# Patient Record
Sex: Male | Born: 1980 | Race: White | Hispanic: No | Marital: Married | Smoking: Current every day smoker
Health system: Southern US, Community
[De-identification: ages and names within clinical notes are randomized; demographics above are authoritative.]

## PROBLEM LIST (undated history)

## (undated) DIAGNOSIS — I10 Essential (primary) hypertension: Secondary | ICD-10-CM

## (undated) DIAGNOSIS — R51 Headache: Secondary | ICD-10-CM

## (undated) DIAGNOSIS — M549 Dorsalgia, unspecified: Secondary | ICD-10-CM

## (undated) DIAGNOSIS — F329 Major depressive disorder, single episode, unspecified: Secondary | ICD-10-CM

## (undated) DIAGNOSIS — F419 Anxiety disorder, unspecified: Secondary | ICD-10-CM

## (undated) DIAGNOSIS — J40 Bronchitis, not specified as acute or chronic: Secondary | ICD-10-CM

## (undated) DIAGNOSIS — F32A Depression, unspecified: Secondary | ICD-10-CM

## (undated) DIAGNOSIS — G8929 Other chronic pain: Secondary | ICD-10-CM

## (undated) HISTORY — PX: HERNIA REPAIR: SHX51

## (undated) HISTORY — DX: Depression, unspecified: F32.A

## (undated) HISTORY — DX: Other chronic pain: G89.29

## (undated) HISTORY — PX: BACK SURGERY: SHX140

## (undated) HISTORY — DX: Major depressive disorder, single episode, unspecified: F32.9

## (undated) HISTORY — DX: Headache: R51

## (undated) HISTORY — DX: Anxiety disorder, unspecified: F41.9

## (undated) HISTORY — DX: Dorsalgia, unspecified: M54.9

---

## 2003-05-16 ENCOUNTER — Emergency Department (HOSPITAL_COMMUNITY): Admission: EM | Admit: 2003-05-16 | Discharge: 2003-05-16 | Payer: Self-pay | Admitting: Emergency Medicine

## 2006-06-11 ENCOUNTER — Ambulatory Visit (HOSPITAL_COMMUNITY): Admission: RE | Admit: 2006-06-11 | Discharge: 2006-06-11 | Payer: Self-pay | Admitting: Family Medicine

## 2006-07-04 ENCOUNTER — Observation Stay (HOSPITAL_COMMUNITY): Admission: RE | Admit: 2006-07-04 | Discharge: 2006-07-05 | Payer: Self-pay | Admitting: Neurosurgery

## 2006-12-05 ENCOUNTER — Emergency Department (HOSPITAL_COMMUNITY): Admission: EM | Admit: 2006-12-05 | Discharge: 2006-12-05 | Payer: Self-pay | Admitting: Emergency Medicine

## 2007-01-04 ENCOUNTER — Emergency Department (HOSPITAL_COMMUNITY): Admission: EM | Admit: 2007-01-04 | Discharge: 2007-01-05 | Payer: Self-pay | Admitting: Emergency Medicine

## 2007-01-15 ENCOUNTER — Ambulatory Visit (HOSPITAL_COMMUNITY): Admission: RE | Admit: 2007-01-15 | Discharge: 2007-01-15 | Payer: Self-pay | Admitting: Neurosurgery

## 2007-07-08 ENCOUNTER — Ambulatory Visit (HOSPITAL_COMMUNITY): Admission: RE | Admit: 2007-07-08 | Discharge: 2007-07-08 | Payer: Self-pay | Admitting: Family Medicine

## 2007-07-15 ENCOUNTER — Ambulatory Visit (HOSPITAL_COMMUNITY): Admission: RE | Admit: 2007-07-15 | Discharge: 2007-07-15 | Payer: Self-pay | Admitting: Family Medicine

## 2007-07-26 ENCOUNTER — Encounter
Admission: RE | Admit: 2007-07-26 | Discharge: 2007-07-26 | Payer: Self-pay | Admitting: Physical Medicine & Rehabilitation

## 2007-08-27 ENCOUNTER — Ambulatory Visit (HOSPITAL_COMMUNITY): Admission: RE | Admit: 2007-08-27 | Discharge: 2007-08-28 | Payer: Self-pay | Admitting: Neurosurgery

## 2007-09-06 ENCOUNTER — Ambulatory Visit: Payer: Self-pay | Admitting: Family Medicine

## 2007-09-06 DIAGNOSIS — G4700 Insomnia, unspecified: Secondary | ICD-10-CM | POA: Insufficient documentation

## 2007-09-06 DIAGNOSIS — F17200 Nicotine dependence, unspecified, uncomplicated: Secondary | ICD-10-CM | POA: Insufficient documentation

## 2007-09-06 DIAGNOSIS — M5137 Other intervertebral disc degeneration, lumbosacral region: Secondary | ICD-10-CM | POA: Insufficient documentation

## 2007-09-19 ENCOUNTER — Ambulatory Visit: Payer: Self-pay | Admitting: Family Medicine

## 2007-09-19 DIAGNOSIS — F3289 Other specified depressive episodes: Secondary | ICD-10-CM | POA: Insufficient documentation

## 2007-09-19 DIAGNOSIS — K5900 Constipation, unspecified: Secondary | ICD-10-CM | POA: Insufficient documentation

## 2007-09-19 DIAGNOSIS — F329 Major depressive disorder, single episode, unspecified: Secondary | ICD-10-CM | POA: Insufficient documentation

## 2007-10-08 ENCOUNTER — Encounter: Admission: RE | Admit: 2007-10-08 | Discharge: 2007-10-08 | Payer: Self-pay | Admitting: Neurosurgery

## 2007-10-25 ENCOUNTER — Encounter (INDEPENDENT_AMBULATORY_CARE_PROVIDER_SITE_OTHER): Payer: Self-pay | Admitting: Family Medicine

## 2007-12-17 ENCOUNTER — Encounter: Admission: RE | Admit: 2007-12-17 | Discharge: 2007-12-17 | Payer: Self-pay | Admitting: Neurosurgery

## 2008-01-16 ENCOUNTER — Ambulatory Visit (HOSPITAL_COMMUNITY): Admission: RE | Admit: 2008-01-16 | Discharge: 2008-01-16 | Payer: Self-pay | Admitting: Neurosurgery

## 2008-05-18 ENCOUNTER — Encounter: Admission: RE | Admit: 2008-05-18 | Discharge: 2008-05-18 | Payer: Self-pay | Admitting: Neurosurgery

## 2008-08-04 ENCOUNTER — Ambulatory Visit: Payer: Self-pay | Admitting: Family Medicine

## 2008-09-18 ENCOUNTER — Encounter
Admission: RE | Admit: 2008-09-18 | Discharge: 2008-09-18 | Payer: Self-pay | Admitting: Physical Medicine & Rehabilitation

## 2008-09-22 ENCOUNTER — Encounter (INDEPENDENT_AMBULATORY_CARE_PROVIDER_SITE_OTHER): Payer: Self-pay | Admitting: Family Medicine

## 2008-09-24 ENCOUNTER — Telehealth (INDEPENDENT_AMBULATORY_CARE_PROVIDER_SITE_OTHER): Payer: Self-pay | Admitting: Family Medicine

## 2008-10-15 ENCOUNTER — Ambulatory Visit (HOSPITAL_COMMUNITY): Admission: RE | Admit: 2008-10-15 | Discharge: 2008-10-15 | Payer: Self-pay | Admitting: Family Medicine

## 2008-10-15 ENCOUNTER — Ambulatory Visit: Payer: Self-pay | Admitting: Family Medicine

## 2008-10-28 ENCOUNTER — Encounter: Admission: RE | Admit: 2008-10-28 | Discharge: 2008-10-28 | Payer: Self-pay | Admitting: Neurosurgery

## 2008-11-10 ENCOUNTER — Encounter (INDEPENDENT_AMBULATORY_CARE_PROVIDER_SITE_OTHER): Payer: Self-pay | Admitting: Family Medicine

## 2009-01-20 ENCOUNTER — Encounter (INDEPENDENT_AMBULATORY_CARE_PROVIDER_SITE_OTHER): Payer: Self-pay | Admitting: Family Medicine

## 2009-03-20 ENCOUNTER — Emergency Department (HOSPITAL_COMMUNITY): Admission: EM | Admit: 2009-03-20 | Discharge: 2009-03-20 | Payer: Self-pay | Admitting: Emergency Medicine

## 2009-06-03 ENCOUNTER — Ambulatory Visit (HOSPITAL_COMMUNITY): Admission: RE | Admit: 2009-06-03 | Discharge: 2009-06-03 | Payer: Self-pay | Admitting: Family Medicine

## 2009-06-22 ENCOUNTER — Ambulatory Visit (HOSPITAL_COMMUNITY): Admission: RE | Admit: 2009-06-22 | Discharge: 2009-06-22 | Payer: Self-pay | Admitting: Family Medicine

## 2009-07-31 ENCOUNTER — Emergency Department (HOSPITAL_COMMUNITY): Admission: EM | Admit: 2009-07-31 | Discharge: 2009-08-01 | Payer: Self-pay | Admitting: Emergency Medicine

## 2009-11-04 ENCOUNTER — Ambulatory Visit (HOSPITAL_COMMUNITY): Admission: RE | Admit: 2009-11-04 | Discharge: 2009-11-04 | Payer: Self-pay | Admitting: Neurosurgery

## 2009-12-02 ENCOUNTER — Emergency Department (HOSPITAL_COMMUNITY): Admission: EM | Admit: 2009-12-02 | Discharge: 2009-12-02 | Payer: Self-pay | Admitting: Emergency Medicine

## 2009-12-03 ENCOUNTER — Encounter: Admission: RE | Admit: 2009-12-03 | Discharge: 2009-12-03 | Payer: Self-pay | Admitting: Occupational Medicine

## 2010-05-22 ENCOUNTER — Encounter: Payer: Self-pay | Admitting: Neurosurgery

## 2010-09-13 NOTE — Op Note (Signed)
Noah Vasquez, DALEO NO.:  [id number]   MEDICAL RECORD NO.:  [id number]          PATIENT TYPE:  INP   LOCATION:  3007                         FACILITY:  [facility]   PHYSICIAN:  Reinaldo Meeker, M.D. DATE OF BIRTH:  12-Dec-1980   DATE OF PROCEDURE:  08/27/2007  DATE OF DISCHARGE:                               OPERATIVE REPORT   PREOPERATIVE DIAGNOSIS:  Pseudoarthrosis L5-S1.   POSTOPERATIVE DIAGNOSIS:  Pseudoarthrosis L5-S1.   PROCEDURE:  Evaluation of fusion L5-S1 with evaluation of hardware L5-S1  followed by L5-S1 posterolateral fusion and replacement of L5-S1 rods.   SURGEON:  Reinaldo Meeker, MD   ASSISTANT:  Kathaleen Maser. Pool, MD   SURGICAL INDICATIONS:  Noah Vasquez is a 30 year old gentleman, who had a  lumbar fusion with pedicle screw instrumentation approximately a year  and a half ago.  He was doing well, but then developed persistent back  pain without radicular pain that was not successfully treated with  conservative therapy.  The patient came to [place] for a second opinion with  a workup that showed definite pseudoarthrosis, but no evidence of bony  union from L5-S1.  We discussed the options, and he requested that we  proceed with his surgery with an exploration of his fusion at L5-S1 and  a posterolateral fusion as well as evaluation of his hardware.  The  patient was admitted at this time.  Risks and benefits were discussed  along with the risks and benefits of nonintervention.  Risks discussed  included, but are not limited to bleeding, infection, weakness, numbness  or paralysis, failure of the instrumentation, nonunion once again, coma,  and death.  We discussed alternative methods of therapy, went through  risks and benefits of nonintervention.  Noah Vasquez has had the  opportunity to ask questions and appears to understand.  With the  information at hand, he has requested that we proceed with surgery and  he is being admitted at this time for this  procedure.   PROCEDURE IN DETAIL:  After placing in a prone position, the patient's  back was prepped and draped in the usual sterile fashion.  Previous  lumbar incision was opened up and carried down towards the level of the  spinous processes.  Dissection was carried into the soft tissues  bilaterally until the pedicle screws and the connecting rod were  identified and dissected free of soft tissue.  The sacrum bilaterally  was then cleaned off of any soft tissue. The far lateral region of L5  was also identified and cleaned up, so that we could easily identify the  transverse processes of L5.  The top-loading caps of the pedicle system  were removed and the rod removed as well.  The screws were tested  independently and found to be solidly in the bone with no evidence of  loosening due to the level bilaterally.  It was therefore elected not to  change up the hardware.  It did not appear, however, to be a solid  fusion at this time across the L5-S1 interspace after evaluation of the  fusion.  The far lateral region was then decorticated with the  transverse process and the far lateral sacrum being decorticated  bilaterally.  Large amounts of irrigation were carried out and then a  combination of BMP and Osteocel Plus were placed in the far lateral  region for posterolateral fusion.  The rods were then resecured and  final tightening was done with the caps.  Final x-ray showed everything  to be in good position.  The wound was then closed in multiple layers  with Vicryl after a drain was left and brought through  a separate stab wound incision.  The wound was then closed with Vicryl  in the muscle fascia, subcutaneous, subcuticular tissues, and staples  were placed on the skin.  A sterile dressing was then applied and the  patient was extubated and returned to recovery room in stable condition.           ______________________________  Reinaldo Meeker, M.D.     ROK/MEDQ  D:   08/27/2007  T:  08/28/2007  Job:  [id number]

## 2010-09-16 NOTE — H&P (Signed)
[name], [name] NO.:  [id number]   MEDICAL RECORD NO.:  [id number]          PATIENT TYPE:  INP   LOCATION:  2899                         FACILITY:  [facility]   PHYSICIAN:  Hewitt Shorts, M.D.DATE OF BIRTH:  1980-09-13   DATE OF ADMISSION:  07/04/2006  DATE OF DISCHARGE:                              HISTORY & PHYSICAL   HISTORY OF PRESENT ILLNESS:  The patient is a 30 year old right-handed  white male who was evaluated for low back pain, left worse than right  with pain radiating to the left lower extremity.  He has had symptoms  for the past three to four years, but it has been worse over the past  two to three months.  Pain is in the left lower extremity that runs to  thigh and leg.  Patient was treated with Voltaren, Vicodin and Flexeril,  but despite the medications and rest, the pain is persistent.  Thus far,  the patient was evaluated with x-rays and MRI scan.  X-rays show a grade  1 spondylolisthesis of L5-S1 secondary to bilateral L5 pars  interarticularis defect.  MRI reveals bilateral neuroforaminal stenosis  at the L5-S1 level.  The patient is admitted now for lumbar  decompression via L5 gill procedure, L5-S1 posterior lumbar interbody  fusion and posterolateral arthrodesis with interbody implants, posterior  instrumentation and bone graft.   PAST MEDICAL HISTORY:  He does not describe any history of hypertension,  myocardial infarction, cancer, stroke, diabetes, peptic ulcer disease or  lung disease.   PREVIOUS SURGICAL HISTORY:  Bilateral inguinal herniorrhaphies on the  right in 1999 and on the left in 2001.   He denies allergies to medications.   CURRENT MEDICATIONS:  Include Voltaren, Flexeril and Vicodin.   FAMILY HISTORY:  Parents are both in poor health, both are age 33,  mother has cirrhosis of the liver, diabetes, hypertension and heart  disease, father has heart disease.   SOCIAL HISTORY:  Patient does production work at  [employer], he is  married, he has three children, he smokes a pack a day, patient has been  smoking for nine years, he does not drink alcoholic beverages, he denies  history of substance abuse.   REVIEW OF SYSTEMS:  Notable for those things described in the History of  Present Illness and Past Medical History, but is otherwise unremarkable.   PHYSICAL EXAMINATION:  GENERAL:  The patient is a well-developed, well-  nourished white male in no acute distress.  VITALS:  Temperature 97.6, pulse 78, blood pressure 122/67, respiratory  rate 16, height 5 feet 11 inches, weight 203 pounds.  LUNGS:  Clear to auscultation.  He has symmetrical respiratory  excursion.  HEART:  Regular rate and rhythm, S1-S2.  There is no murmur.  EXTREMITY EXAMINATION:  Shows no clubbing, cyanosis or edema.  MUSCULOSKELETAL EXAMINATION:  Shows limitations in mobility due to low  back.  He is limited in flexion to 60 degrees, limited in extension to  10 degrees.  There is no tenderness to palpation over the lumbar spinous  process or paralumbar musculature.  Straight leg  raising is negative  bilaterally.  NEUROLOGICAL EXAMINATION:  Shows 5/5 strength in the lower extremities  including the iliopsoas, quadriceps, dorsiflex, extensor hallux longus  and plantar flexor bilaterally.  Sensation is intact to pinprick.  The  upper and lower extremity reflexes are absent in the biceps, brachialis;  minimal in the triceps, quadriceps and gastrocnemius, symmetrical  bilaterally.  Toes are downgoing bilaterally, he has normal gait and  stance.   IMPRESSION:  1. Low back pain, left worse than right with left lumbar radicular      pain with intact motor and sensory function.  2. Patient has an L5 pars defect with a grade 1 spondylolisthesis of      L5-S1 and bilateral L5-S1 neuroforaminal stenosis.   PLAN:  The patient will be admitted for decompression and stabilization  at the L5-S1 level.  We have discussed the  nature of his condition, the  nature of surgery, the alternatives to surgery and the typical length of  surgery, hospital stay and overall recuperation, his limitations  postoperatively, need for postoperative immobilization, a lumber corset  and the plan to use a Cell Saver during surgery.  We discussed risks,  surgical risks of infection, bleeding, possibility of transfusion, the  risk of nerve root dysfunction with pain, weakness, numbness or  paresthesias, the risk of dural tear, CSF leakage and possible need for  further surgery, the risk of failure of the arthrodesis and anesthetic  risk of myocardial infarction, stroke, pneumonia and death.  He  understands that many of these risks are increased due to his  significant smoking habit and we strongly encouraged him to quit  smoking.  Patient has an understanding of all this, he wishes to go  ahead with surgery and is admitted for such.      Hewitt Shorts, M.D.  Electronically Signed     RWN/MEDQ  D:  07/04/2006  T:  07/04/2006  Job:  [id number]

## 2010-09-16 NOTE — Op Note (Signed)
NAMESAMIER, JACO NO.:  [id number]   MEDICAL RECORD NO.:  [id number]          PATIENT TYPE:  INP   LOCATION:  3010                         FACILITY:  [facility]   PHYSICIAN:  Hewitt Shorts, M.D.DATE OF BIRTH:  1981/03/06   DATE OF PROCEDURE:  07/04/2006  DATE OF DISCHARGE:                               OPERATIVE REPORT   PREOPERATIVE DIAGNOSES:  1. L5 pars interarticularis defect.  2. L5-S1 grade 1 spondylolisthesis.  3. L5-S1 bilateral neural foraminal stenosis, lumbar spondylosis, and      lumbar radiculopathy.   POSTOPERATIVE DIAGNOSIS:  1. L5 pars interarticularis defect.  2. L5-S1 grade 1 spondylolisthesis.  3. L5-S1 bilateral neural foraminal stenosis, lumbar spondylosis, and      lumbar radiculopathy.   PROCEDURE:  1. L5-S1 lumbar decompression via an L5 Gill procedure and a bilateral      L5-S1 microdiskectomy with microdissection, as well as L5-S1      stabilization via an L5-S1 posterior lumbar interbody fusion with      PEEK interbody implants and locally harvested morselized autograft.  2. L5-S1 posterolateral arthrodesis with radius posterior      instrumentation and Vitoss with bone marrow aspirate.   SURGEON:  Hewitt Shorts, M.D.   ASSISTANT:  Jerald Kief, RN and Channing Mutters.   ANESTHESIA:  General endotracheal.   INDICATION:  The patient is a 30 year old man who presented with low  back and left lumbar radicular pain.  He was found to have bilateral L5  pars interarticularis defect with a grade 1 spondylolisthesis of L5 and  S1.  There is bilateral neural foraminal stenosis, moderate  pseudoarthrosis associated with the pars defect.  A decision was made to  proceed with decompression and stabilization.   PROCEDURE:  The patient brought to the operating room and placed under  general endotracheal anesthesia.  The patient was turned to the prone  position.  The lumbar region was prepped with Betadine and soap solution  and draped in a  sterile fashion.  The midline was infiltrated with local  anesthetic with epinephrine and an x-ray was taken and the L5-S1 level  identified.  A midline incision was made over the L5-S1 level and  carried down through the subcutaneous tissue.  Bipolar cautery and  electrocautery was used to maintain hemostasis.  Dissection was carried  down through the lumbar fascia, which was incised bilaterally and the  paraspinous muscles were dissected from the spinous process of lamina in  a subperiosteal fashion.  Self-retaining retractor was placed and an x-  ray taken and the L5-S1 interlaminar space was identified.  Dissection  was then carried out laterally.  We were able to expose the ala of S1  and the transverse process of L5 bilaterally.  We then dissected around  the loose posterior element of L5.  There was a moderate amount of  pseudoarthrosis that was carefully dissected away and we were eventually  able to mobilize and remove the posterior element of L5.  It was cleaned  of soft tissue and cartilage surface and then morselized to later be  used as bone  graft material.  Using magnification, we microsurgically  decompressed the exiting L5 and S1 nerve roots bilaterally.  There was  significant pseudoarthrosis encroaching on the exiting L5 nerve roots.  Then epidural veins were coagulated and divided and we were able to  expose the anulus of the L5-S1 disk.  The listhesis was noted and there  was spondylitic overgrowth posteriorly from both the posterior aspect of  L5 and the posterior aspect of S1.  The annulus was incised bilaterally  and thorough discectomy was performed using a variety of pituitary  rongeurs and micro curets.  The end plates of the vertebrae were  prepared using paddle curets to remove the cartilaginous surfaces down  to bony surface.  We measured the height of the intervertebral disk  space and selected 12-mm implants with a 4-degree lordosis.  Each of  these PEEK  implants was packed with harvested morselized autograft and  then carefully tracking the thecal sac and nerve root, we were able to  gently position the implants in the intervertebral disk space.  We  packed bone between the implants, as well as lateral to each.  We then  went ahead and decorticated the transverse process of L5 and the ala of  S1 bilaterally.  Then the C-arm fluoroscope was draped and brought into  the field to provide visualization as we placed pedicle screws at L5 and  S1 bilaterally.  Using C-arm fluoroscopic guidance, each of the pedicles  was probed.  They were each examined with a ball probe.  No cut-outs  were found.  Each was tapped with a 5.25 mm tap and again examining with  a ball probe, again no cut-outs were found and good threading was noted.  Then we placed a 5.75 x 40-mm screws bilaterally at L5 and 5.75 x 35-mm  screws bilaterally at S1.  A C-arm view showed good trapezoidal  trajectories for the screws.  We then packed Vitoss saturated in bone  marrow aspirate (which had been aspirated from the vertebral body of S1  after the pedicle had been probed.)  The Vitoss and bone marrow aspirate  was packed in the lateral gutters over the transverse process of L5 and  the ala of S1 and the intertransverse space.  We then placed 30-mm  prelordosed rods in the screw heads.  They were secured with locking  caps, which were tightened against a counter-torque.  Then the wound was  irrigated one last time (it had been irrigated with saline and  subsequently bacitracin solution on numerous occasions through the  procedure).  Then we proceeded with closure.  The deep fascia was closed  with interrupted undyed 1 Vicryl suture, Scarpa fascia closed with  interrupted undyed 1 Vicryl suture, the subcutaneous and subcuticular  were closed with interrupted inverted 2-0 and 3-0 undyed Vicryl sutures,  and the skin was reapproximated with Dermabond.  The wound was dressed with  Adaptic and sterile gauze.  The procedure was tolerated well.  The  estimated blood loss was 250 mL.  They were able to return 200 mL of  Cell Saver blood.  Sponge and needle count were correct.  Following  surgery, the patient was returned back to the supine position, to be  reversed from anesthetic, extubated, and transferred to the recovery  room for further care.      Hewitt Shorts, M.D.  Electronically Signed     RWN/MEDQ  D:  07/04/2006  T:  07/04/2006  Job:  [id number]

## 2011-01-24 LAB — TYPE AND SCREEN
ABO/RH(D): O POS
Antibody Screen: NEGATIVE

## 2011-01-24 LAB — CBC
Platelets: 236
RDW: 13.1
WBC: 10

## 2011-01-29 IMAGING — CR DG KNEE COMPLETE 4+V*L*
4 series · 4 of 4 positions shown · non-contrast
Comparison: None.

CLINICAL DATA: Fell with pain medially

LEFT KNEE - COMPLETE 4+ VIEW

[view not recorded (1 of 4)]
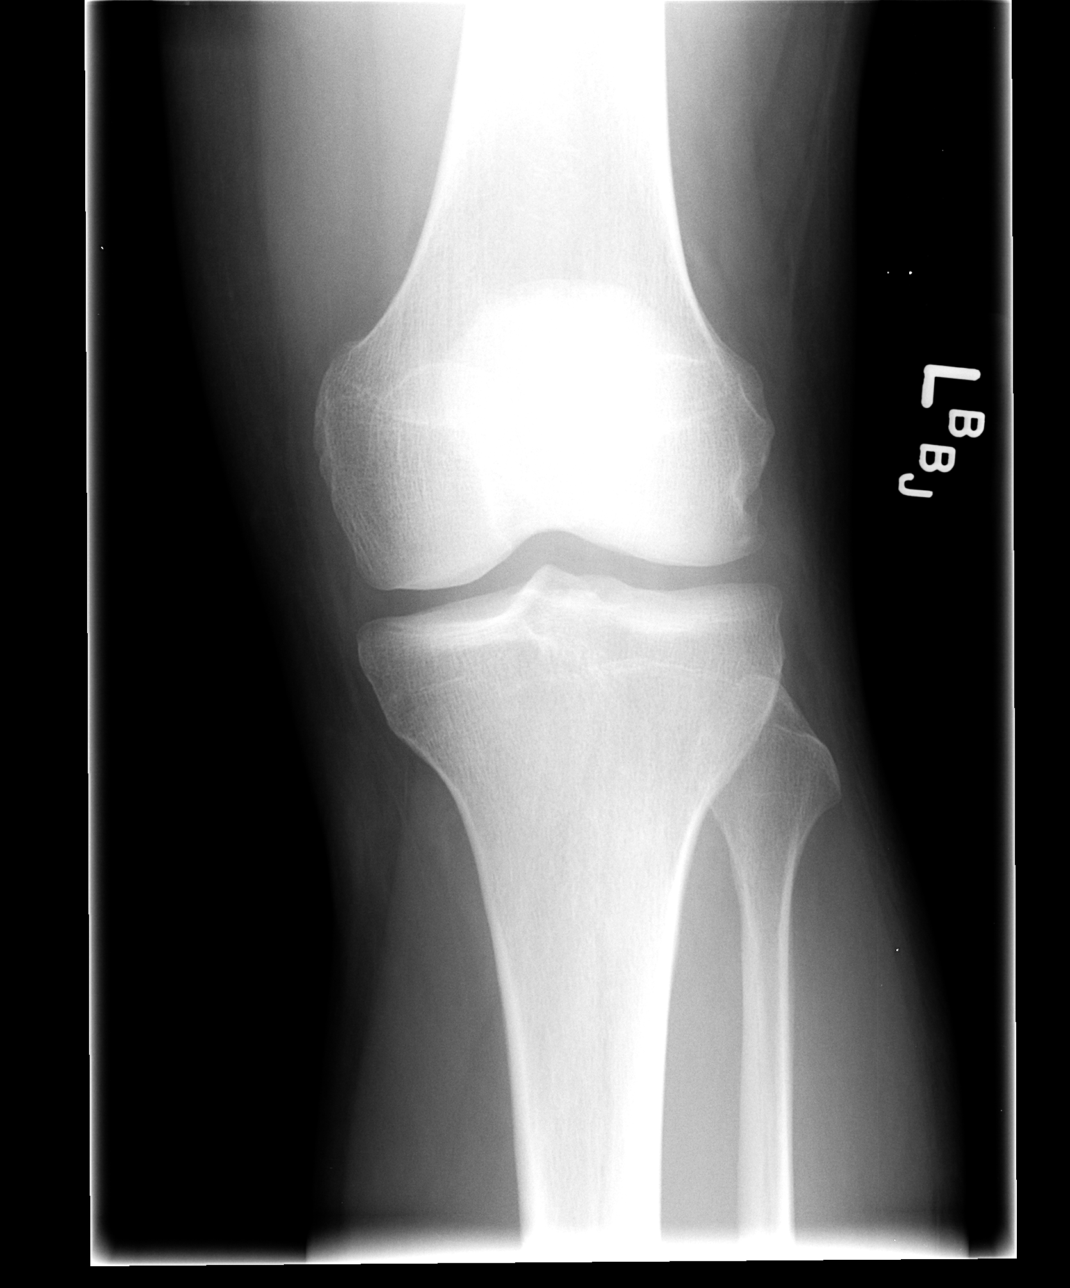

[view not recorded (2 of 4)]
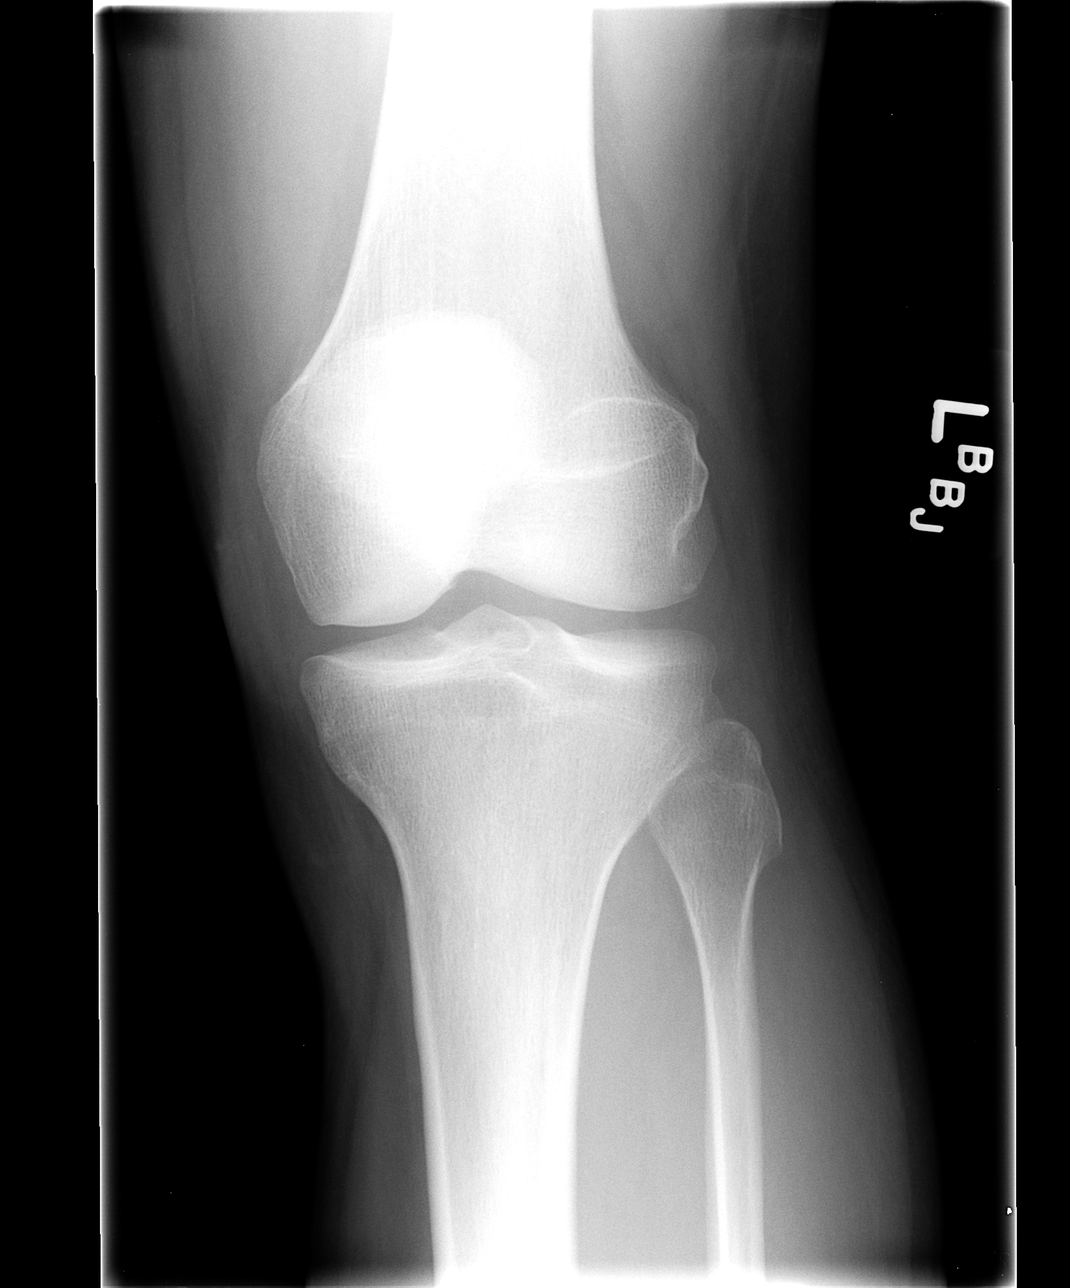

[view not recorded (3 of 4)]
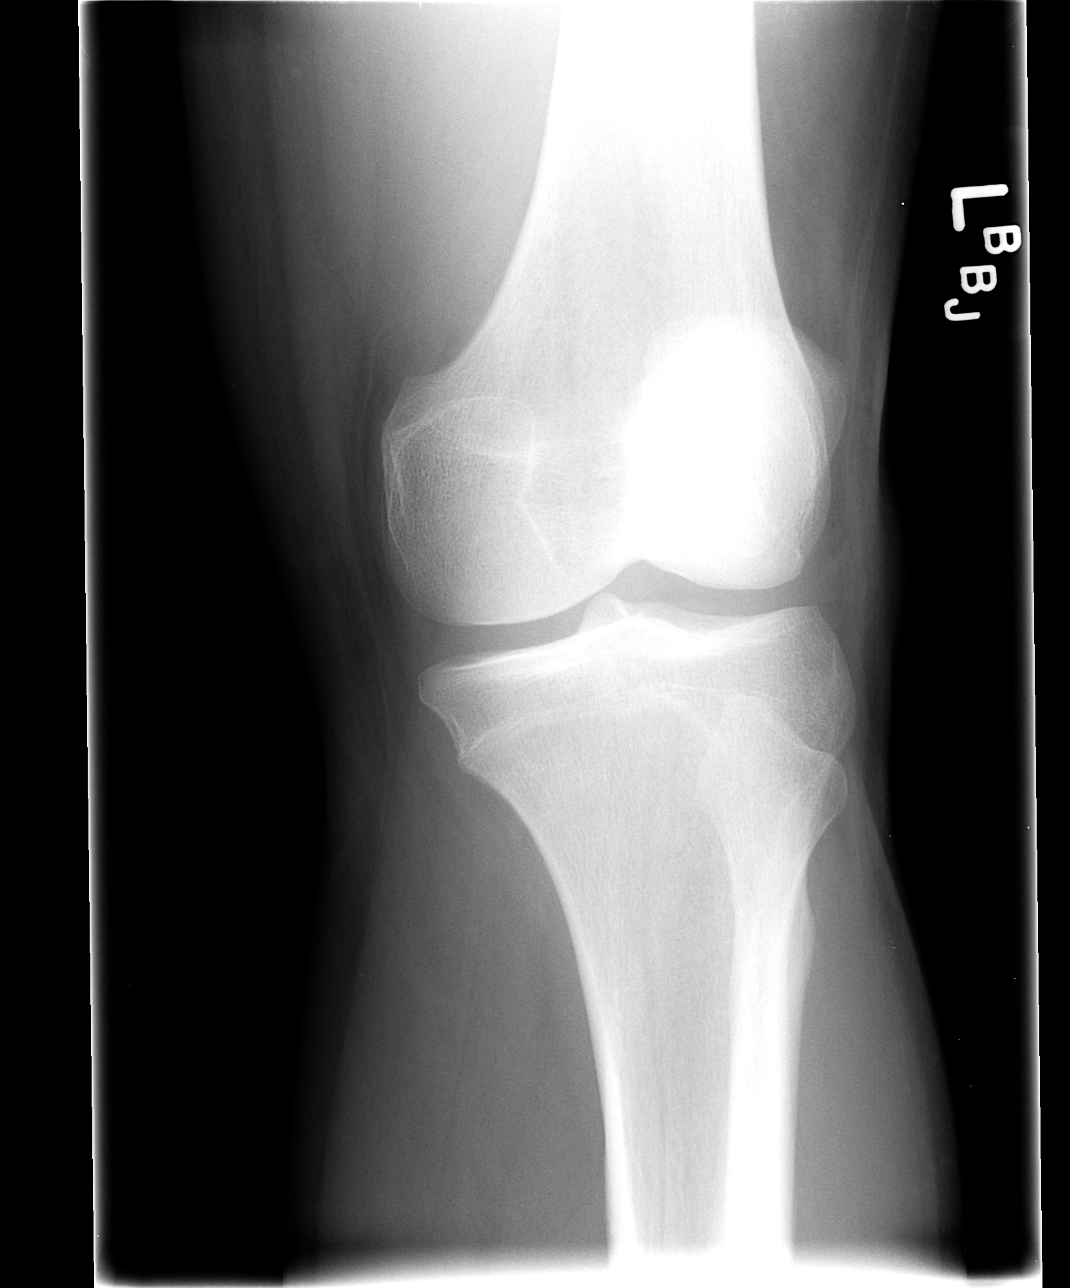

[view not recorded (4 of 4)]
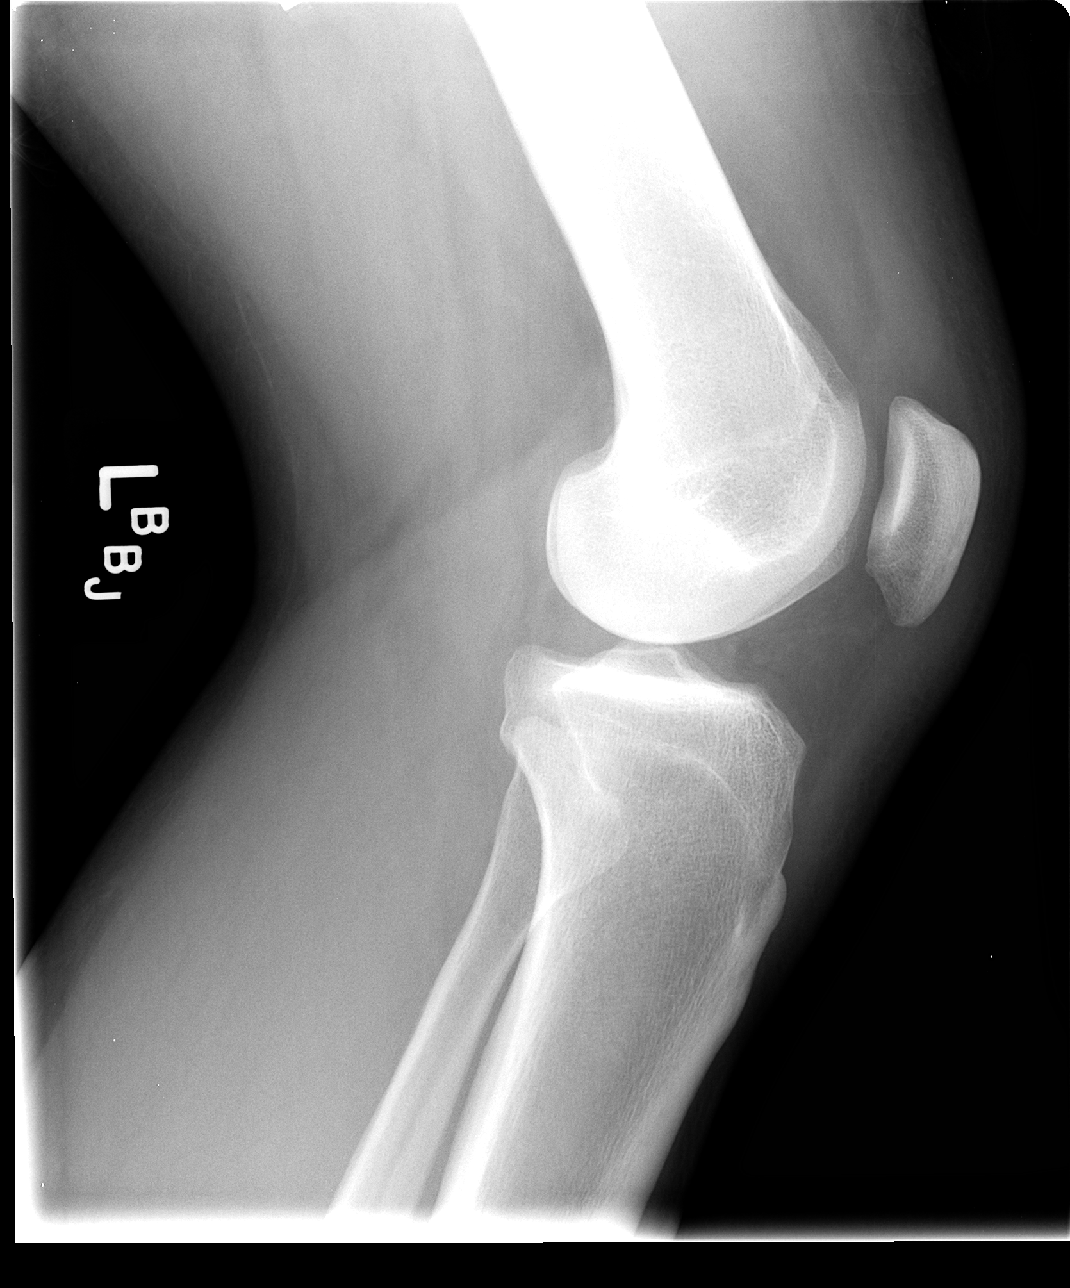

[4 of 4 positions shown; findings below may reference images not displayed]

FINDINGS: The left knee joint spaces are normal.  No fracture is
seen.  No effusion is noted.
IMPRESSION: Negative left knee.

## 2011-06-07 ENCOUNTER — Other Ambulatory Visit (HOSPITAL_COMMUNITY): Payer: Self-pay | Admitting: Family Medicine

## 2011-06-07 ENCOUNTER — Ambulatory Visit (HOSPITAL_COMMUNITY)
Admission: RE | Admit: 2011-06-07 | Discharge: 2011-06-07 | Disposition: A | Discharge status: Home / Self Care | Payer: 59 | Source: Ambulatory Visit | Attending: Family Medicine | Admitting: Family Medicine

## 2011-06-07 DIAGNOSIS — M542 Cervicalgia: Secondary | ICD-10-CM | POA: Insufficient documentation

## 2011-06-07 DIAGNOSIS — R209 Unspecified disturbances of skin sensation: Secondary | ICD-10-CM | POA: Insufficient documentation

## 2011-06-07 DIAGNOSIS — R51 Headache: Secondary | ICD-10-CM | POA: Insufficient documentation

## 2012-01-31 ENCOUNTER — Encounter (HOSPITAL_COMMUNITY): Payer: Self-pay | Admitting: *Deleted

## 2012-01-31 ENCOUNTER — Emergency Department (HOSPITAL_COMMUNITY)
Admission: EM | Admit: 2012-01-31 | Discharge: 2012-01-31 | Disposition: A | Discharge status: Home / Self Care | Payer: 59 | Attending: Emergency Medicine | Admitting: Emergency Medicine

## 2012-01-31 ENCOUNTER — Emergency Department (HOSPITAL_COMMUNITY): Payer: 59

## 2012-01-31 DIAGNOSIS — Z888 Allergy status to other drugs, medicaments and biological substances status: Secondary | ICD-10-CM | POA: Insufficient documentation

## 2012-01-31 DIAGNOSIS — F17200 Nicotine dependence, unspecified, uncomplicated: Secondary | ICD-10-CM | POA: Insufficient documentation

## 2012-01-31 DIAGNOSIS — J40 Bronchitis, not specified as acute or chronic: Secondary | ICD-10-CM | POA: Insufficient documentation

## 2012-01-31 MED ORDER — ALBUTEROL SULFATE HFA 108 (90 BASE) MCG/ACT IN AERS
2.0000 | INHALATION_SPRAY | Freq: Once | RESPIRATORY_TRACT | Status: AC
  Administered 2012-01-31: 2 via RESPIRATORY_TRACT
  Filled 2012-01-31: qty 6.7

## 2012-01-31 NOTE — ED Notes (Signed)
Patient with no complaints at this time. Respirations even and unlabored. Skin warm/dry. Discharge instructions reviewed with patient at this time. Patient given opportunity to voice concerns/ask questions. Patient discharged at this time and left Emergency Department with steady gait.   

## 2012-01-31 NOTE — ED Provider Notes (Signed)
History     CSN: [id number]  Arrival date & time 01/31/12  4098   First MD Initiated Contact with Patient 01/31/12 [phone]      Chief Complaint  Patient presents with  . Bronchitis     The history is provided by the patient.  The patient reports cough congestion and nasal congestion over the past several days.  He continues to smoke cigarettes but does smoke less than the past couple days.  He reports this feels like "bronchitis".  Patient denies fevers or chills.  His cough has been nonproductive.he denies abdominal pain.  His symptoms are mild to moderate in severity he has not tried any over-the-counter medications.  History reviewed. No pertinent past medical history.  Past Surgical History  Procedure Date  . Back surgery   . Hernia repair     No family history on file.  History  Substance Use Topics  . Smoking status: Current Every Day Smoker -- 1.0 packs/day    Types: Cigarettes  . Smokeless tobacco: Not on file  . Alcohol Use: No      Review of Systems  All other systems reviewed and are negative.    Allergies  Wellbutrin  Home Medications   Current Outpatient Rx  Name Route Sig Dispense Refill  . CYCLOBENZAPRINE HCL 10 MG PO TABS Oral Take 10 mg by mouth 3 (three) times daily as needed.    . IBUPROFEN 800 MG PO TABS Oral Take 800 mg by mouth every 8 (eight) hours as needed.    . OXYCODONE-ACETAMINOPHEN 10-325 MG PO TABS Oral Take 1 tablet by mouth 2 (two) times daily as needed.      BP 141/76  Pulse 88  Temp 98.2 F (36.8 C) (Oral)  Resp 20  Ht 5\' 9"  (1.753 m)  Wt 226 lb (102.513 kg)  BMI 33.37 kg/m2  SpO2 97%  Physical Exam  Nursing note and vitals reviewed. Constitutional: He is oriented to person, place, and time. He appears well-developed and well-nourished.  HENT:  Head: Normocephalic and atraumatic.  Eyes: EOM are normal.  Neck: Normal range of motion.  Cardiovascular: Normal rate, regular rhythm, normal heart sounds and intact distal  pulses.   Pulmonary/Chest: Effort normal and breath sounds normal. No respiratory distress. He has no wheezes. He has no rales.  Abdominal: Soft. He exhibits no distension. There is no tenderness.  Musculoskeletal: Normal range of motion.  Neurological: He is alert and oriented to person, place, and time.  Skin: Skin is warm and dry.  Psychiatric: He has a normal mood and affect. Judgment normal.    ED Course  Procedures (including critical care time)  Labs Reviewed - No data to display Dg Chest 2 View  01/31/2012  *RADIOLOGY REPORT*  Clinical Data: Bronchitis.  Cough and congestion.  Tightness in chest.  CHEST - 2 VIEW  Comparison: Two-view chest 07/31/2009.  Findings: Mild perihilar bronchitic changes are present bilaterally.  No focal airspace disease is evident.  The visualized soft tissues and bony thorax are unremarkable.  IMPRESSION:  1.  Mild perihilar bronchitic changes bilaterally appear chronic. 2.  No acute focal airspace disease.   Original Report Authenticated By: Jamesetta Orleans. MATTERN, M.D.     I personally reviewed the imaging tests through PACS system  I reviewed available ER/hospitalization records thought the EMR   1. Bronchitis       MDM  Likely viral upper respiratory tract infections/bronchitis.  The patient is well-appearing.  he is nontoxic.  No hypoxia  on exam.  Lung exam is clear.  Normal work of breathing.  cxr clear.   Close followup with PCP. Improvement with albuterol in ER         Lyanne Co, MD 01/31/12 [phone]

## 2012-01-31 NOTE — ED Notes (Signed)
Pt reports non productive cough, feels sob. Denies running fever, N/V has has some diarrhea

## 2012-04-02 ENCOUNTER — Other Ambulatory Visit: Payer: Self-pay | Admitting: Family Medicine

## 2012-04-02 DIAGNOSIS — M545 Low back pain: Secondary | ICD-10-CM

## 2012-04-09 ENCOUNTER — Ambulatory Visit (HOSPITAL_COMMUNITY)
Admission: RE | Admit: 2012-04-09 | Discharge: 2012-04-09 | Disposition: A | Discharge status: Home / Self Care | Payer: 59 | Source: Ambulatory Visit | Attending: Family Medicine | Admitting: Family Medicine

## 2012-04-09 DIAGNOSIS — M545 Low back pain: Secondary | ICD-10-CM

## 2012-04-09 DIAGNOSIS — M5126 Other intervertebral disc displacement, lumbar region: Secondary | ICD-10-CM | POA: Insufficient documentation

## 2012-04-09 MED ORDER — GADOBENATE DIMEGLUMINE 529 MG/ML IV SOLN: 20.0000 mL | Freq: Once | INTRAVENOUS | Status: AC | PRN

## 2012-05-01 HISTORY — PX: SACRAL NERVE STIMULATOR PLACEMENT: SHX2367

## 2012-08-09 ENCOUNTER — Encounter: Payer: Self-pay | Admitting: *Deleted

## 2012-08-15 ENCOUNTER — Encounter: Payer: Self-pay | Admitting: Family Medicine

## 2012-08-15 ENCOUNTER — Ambulatory Visit (INDEPENDENT_AMBULATORY_CARE_PROVIDER_SITE_OTHER): Payer: 59 | Admitting: Family Medicine

## 2012-08-15 VITALS — BP 132/86 | HR 80 | Wt 234.4 lb

## 2012-08-15 DIAGNOSIS — G4700 Insomnia, unspecified: Secondary | ICD-10-CM

## 2012-08-15 DIAGNOSIS — M5137 Other intervertebral disc degeneration, lumbosacral region: Secondary | ICD-10-CM

## 2012-08-15 NOTE — Patient Instructions (Signed)
Keep walking and exercising as much as possible.

## 2012-08-15 NOTE — Progress Notes (Signed)
  Subjective:    Patient ID: Noah Vasquez, male    DOB: 1981/02/25, 32 y.o.   MRN: [id number]  HPI  Tried the trialo nerve stimulator. Helped a lt. When cut off, neuropathic pain came back.  Walking a fair amount. Scheduled to see spec. For long-term nerve stim insertion. Dr. Eduard Clos rec. No work until [employer]. Insertion is one-day surg. States unable to work at this point also. Review of Systems    otherwise neg Objective:   Physical Exam  Alert no acute distress. Lungs clear. Heart regular rate and rhythm. Vitals reviewed. Positive low back pain to percussion. Plus minus straight leg raise on left.      Assessment & Plan:  Impression chronic back pain with neuropathic features and complete failure on conservative therapy. Trial of the nerve stimulator seemed to help considerably. Plan schedule for surgical insertion of permanent nerve stimulator. Maintain same meds. Followup as needed. Will turnover management a specialist at this time. WSL

## 2012-08-23 ENCOUNTER — Telehealth: Payer: Self-pay

## 2012-08-23 MED ORDER — AZITHROMYCIN 250 MG PO TABS: ORAL_TABLET | ORAL | Status: AC

## 2012-08-23 NOTE — Telephone Encounter (Signed)
zpk

## 2012-08-23 NOTE — Telephone Encounter (Signed)
RX sent in to pharmacy. Patient notified.

## 2012-08-23 NOTE — Telephone Encounter (Signed)
Patient states that he is having surgery coming up soon next month in May and now believes he has developed bronchitis. He has wheezing and congestion currently. Wants to know can a antibiotic be sent in for him so he can be well by the time his surgery is scheduled? [facility] ( last office visit was 08/15/2012 for regular follow up)

## 2012-09-04 ENCOUNTER — Ambulatory Visit: Payer: Self-pay | Admitting: Pain Medicine

## 2012-09-05 ENCOUNTER — Other Ambulatory Visit: Payer: Self-pay | Admitting: Family Medicine

## 2012-09-06 NOTE — Telephone Encounter (Signed)
Ok wis 3 re ref monthly

## 2012-09-12 ENCOUNTER — Ambulatory Visit: Payer: Self-pay | Admitting: Pain Medicine

## 2012-09-24 ENCOUNTER — Ambulatory Visit: Payer: Self-pay | Admitting: Pain Medicine

## 2013-01-16 ENCOUNTER — Ambulatory Visit (HOSPITAL_COMMUNITY)
Admission: RE | Admit: 2013-01-16 | Discharge: 2013-01-16 | Disposition: A | Discharge status: Home / Self Care | Payer: 59 | Source: Ambulatory Visit | Attending: Physical Medicine and Rehabilitation | Admitting: Physical Medicine and Rehabilitation

## 2013-01-16 ENCOUNTER — Other Ambulatory Visit (HOSPITAL_COMMUNITY): Payer: Self-pay | Admitting: Physical Medicine and Rehabilitation

## 2013-01-16 DIAGNOSIS — M549 Dorsalgia, unspecified: Secondary | ICD-10-CM

## 2013-01-16 DIAGNOSIS — Z9889 Other specified postprocedural states: Secondary | ICD-10-CM | POA: Insufficient documentation

## 2013-01-16 DIAGNOSIS — R229 Localized swelling, mass and lump, unspecified: Secondary | ICD-10-CM | POA: Insufficient documentation

## 2013-01-19 ENCOUNTER — Emergency Department (HOSPITAL_COMMUNITY)
Admission: EM | Admit: 2013-01-19 | Discharge: 2013-01-19 | Disposition: A | Discharge status: Home / Self Care | Payer: 59 | Attending: Emergency Medicine | Admitting: Emergency Medicine

## 2013-01-19 ENCOUNTER — Encounter (HOSPITAL_COMMUNITY): Payer: Self-pay | Admitting: *Deleted

## 2013-01-19 DIAGNOSIS — F329 Major depressive disorder, single episode, unspecified: Secondary | ICD-10-CM | POA: Insufficient documentation

## 2013-01-19 DIAGNOSIS — Z792 Long term (current) use of antibiotics: Secondary | ICD-10-CM | POA: Insufficient documentation

## 2013-01-19 DIAGNOSIS — F3289 Other specified depressive episodes: Secondary | ICD-10-CM | POA: Insufficient documentation

## 2013-01-19 DIAGNOSIS — Z79899 Other long term (current) drug therapy: Secondary | ICD-10-CM | POA: Insufficient documentation

## 2013-01-19 DIAGNOSIS — K029 Dental caries, unspecified: Secondary | ICD-10-CM

## 2013-01-19 DIAGNOSIS — F411 Generalized anxiety disorder: Secondary | ICD-10-CM | POA: Insufficient documentation

## 2013-01-19 DIAGNOSIS — G8929 Other chronic pain: Secondary | ICD-10-CM | POA: Insufficient documentation

## 2013-01-19 DIAGNOSIS — K0889 Other specified disorders of teeth and supporting structures: Secondary | ICD-10-CM

## 2013-01-19 DIAGNOSIS — K089 Disorder of teeth and supporting structures, unspecified: Secondary | ICD-10-CM | POA: Insufficient documentation

## 2013-01-19 DIAGNOSIS — F17200 Nicotine dependence, unspecified, uncomplicated: Secondary | ICD-10-CM | POA: Insufficient documentation

## 2013-01-19 MED ORDER — PENICILLIN V POTASSIUM 250 MG PO TABS: 250.0000 mg | ORAL_TABLET | Freq: Four times a day (QID) | ORAL | Status: DC

## 2013-01-19 MED ORDER — HYDROCODONE-ACETAMINOPHEN 5-325 MG PO TABS: ORAL_TABLET | ORAL | Status: DC

## 2013-01-19 MED ORDER — NAPROXEN 250 MG PO TABS: 250.0000 mg | ORAL_TABLET | Freq: Two times a day (BID) | ORAL | Status: DC

## 2013-01-19 NOTE — ED Notes (Signed)
Pt alert & oriented x4, stable gait. Patient given discharge instructions, paperwork & prescription(s). Patient  instructed to stop at the registration desk to finish any additional paperwork. Patient verbalized understanding. Pt left department w/ no further questions. 

## 2013-01-19 NOTE — ED Provider Notes (Signed)
CSN: [id number]     Arrival date & time 01/19/13  0555 History   First MD Initiated Contact with Patient 01/19/13 [phone]     Chief Complaint  Patient presents with  . Dental Pain  . Oral Swelling    HPI Pt was seen at 0620. Per pt, c/o gradual onset and persistence of constant right lower teeth "pain" for the past several days. Has been associated with right lower jaw "swelling." Denies fevers, no intra-oral edema, no rash, no dysphagia, no neck pain. The condition is aggravated by nothing. The condition is relieved by nothing. The symptoms have been associated with no other complaints. The patient has no significant history of serious medical conditions.     Past Medical History  Diagnosis Date  . Depression   . Anxiety   . Chronic back pain   . JXBJYNWG(956.2)    Past Surgical History  Procedure Laterality Date  . Back surgery    . Hernia repair      History  Substance Use Topics  . Smoking status: Current Every Day Smoker -- 1.00 packs/day    Types: Cigarettes  . Smokeless tobacco: Not on file  . Alcohol Use: No    Review of Systems ROS: Statement: All systems negative except as marked or noted in the HPI; Constitutional: Negative for fever and chills. ; ; Eyes: Negative for eye pain and discharge. ; ; ENMT: Positive for lower right facial swelling, dental caries, dental hygiene poor and toothache. Negative for ear pain, bleeding gums, dental injury, facial deformity, hoarseness, nasal congestion, sinus pressure, sore throat, throat swelling and tongue swollen. ; ; Cardiovascular: Negative for chest pain, palpitations, diaphoresis, dyspnea and peripheral edema. ; ; Respiratory: Negative for cough, wheezing and stridor. ; ; Gastrointestinal: Negative for nausea, vomiting, diarrhea and abdominal pain. ; ; Genitourinary: Negative for dysuria, flank pain and hematuria. ; ; Musculoskeletal: Negative for back pain and neck pain. ; ; Skin: Negative for rash and skin lesion. ; ; Neuro:  Negative for headache, lightheadedness and neck stiffness. ;    Allergies  Wellbutrin  Home Medications   Current Outpatient Rx  Name  Route  Sig  Dispense  Refill  . baclofen (LIORESAL) 10 MG tablet   Oral   Take 10 mg by mouth 4 (four) times daily as needed.         . clonazePAM (KLONOPIN) 1 MG tablet      TAKE 1 TO 1 & 1/2 TABLETS BY MOUTH 30 MINUTES TO 1 HOUR BEFORE BEDTIME.   45 tablet   3     Must last 30 days   . ibuprofen (ADVIL,MOTRIN) 800 MG tablet   Oral   Take 800 mg by mouth every 8 (eight) hours as needed.         . cloNIDine (CATAPRES - DOSED IN MG/24 HR) 0.1 mg/24hr patch   Transdermal   Place 1 patch onto the skin once a week.         . Diclofenac (ZORVOLEX) 35 MG CAPS   Oral   Take 1 capsule by mouth 3 (three) times daily.         Marland Kitchen HYDROcodone-acetaminophen (NORCO/VICODIN) 5-325 MG per tablet      1 or 2 tabs PO q6 hours prn pain   8 tablet   0   . naproxen (NAPROSYN) 250 MG tablet   Oral   Take 1 tablet (250 mg total) by mouth 2 (two) times daily with a meal.   14  tablet   0   . penicillin v potassium (VEETID) 250 MG tablet   Oral   Take 1 tablet (250 mg total) by mouth 4 (four) times daily.   20 tablet   0    BP 135/83  Pulse 80  Temp(Src) 98.4 F (36.9 C) (Oral)  Resp 16  Ht 5\' 9"  (1.753 m)  Wt 221 lb (100.245 kg)  BMI 32.62 kg/m2  SpO2 99% Physical Exam 0625: Physical examination: Vital signs and O2 SAT: Reviewed; Constitutional: Well developed, Well nourished, Well hydrated, In no acute distress; Head and Face: Normocephalic, Atraumatic. +mild right lower mandible edema without overlying erythema.; Eyes: EOMI, PERRL, No scleral icterus; ENMT: Mouth and pharynx normal, Poor dentition, Widespread dental decay, Left TM normal, Right TM normal, Mucous membranes moist, +lower right premolars and molars with extensive dental decay.  No gingival erythema, edema, fluctuance, or drainage.  No intra-oral edema. No submandibular or  sublingual edema. No hoarse voice, no drooling, no stridor.  ; Neck: Supple, Full range of motion, No lymphadenopathy; Cardiovascular: Regular rate and rhythm, No murmur, rub, or gallop; Respiratory: Breath sounds clear & equal bilaterally, No rales, rhonchi, wheezes, Normal respiratory effort/excursion; Chest: Nontender, Movement normal; Extremities: Pulses normal, No tenderness, No edema; Neuro: AA&Ox3, Major CN grossly intact.  No gross focal motor or sensory deficits in extremities.; Skin: Color normal, No rash, No petechiae, Warm, Dry   ED Course  Procedures    MDM  MDM Reviewed: previous chart, nursing note and vitals     0640:  Pt encouraged to f/u with dentist or oral surgeon for his dental needs for good continuity of care and definitive treatment.  Verb understanding.     Laray Anger, DO 01/20/13 1246

## 2013-01-19 NOTE — ED Notes (Signed)
Pt reports having bad teeth, pain started yesterday, woke this morning w/ facial swelling.

## 2013-01-24 ENCOUNTER — Ambulatory Visit (INDEPENDENT_AMBULATORY_CARE_PROVIDER_SITE_OTHER): Payer: 59 | Admitting: Family Medicine

## 2013-01-24 ENCOUNTER — Encounter: Payer: Self-pay | Admitting: Family Medicine

## 2013-01-24 VITALS — BP 128/88 | Temp 98.4°F | Ht 69.0 in | Wt 221.6 lb

## 2013-01-24 DIAGNOSIS — J683 Other acute and subacute respiratory conditions due to chemicals, gases, fumes and vapors: Secondary | ICD-10-CM

## 2013-01-24 DIAGNOSIS — J45909 Unspecified asthma, uncomplicated: Secondary | ICD-10-CM

## 2013-01-24 DIAGNOSIS — J209 Acute bronchitis, unspecified: Secondary | ICD-10-CM

## 2013-01-24 MED ORDER — ALBUTEROL SULFATE HFA 108 (90 BASE) MCG/ACT IN AERS: 2.0000 | INHALATION_SPRAY | Freq: Four times a day (QID) | RESPIRATORY_TRACT | Status: DC | PRN

## 2013-01-24 MED ORDER — LEVOFLOXACIN 500 MG PO TABS: 500.0000 mg | ORAL_TABLET | Freq: Every day | ORAL | Status: AC

## 2013-01-24 MED ORDER — PREDNISONE 20 MG PO TABS: ORAL_TABLET | ORAL | Status: AC

## 2013-01-24 NOTE — Progress Notes (Signed)
  Subjective:    Patient ID: Noah Vasquez, male    DOB: [birth year], 32 y.o.   MRN: [id number]  HPI Patient is here today b/c he believes he has bronchitis. It started Wednesday evening. He has had it in the past.   coughin up gunk. phlegm  Sig wheeezing, productive at times  Diminished energy some achiness. Unfortunately patient still smoking.    Review of Systems No vomiting no diarrhea ROS otherwise negative    Objective:   Physical Exam Alert hydration good. Lungs bilateral significant wheezes and rhonchi. Heart regular rate and rhythm. HEENT moderate nasal congestion. Frontal tenderness.       Assessment & Plan:  Impression subacute bronchitis with reactive airways. Plan prednisone taper. Levaquin. Ventolin when necessary. Symptomatic care discussed. WSL

## 2013-01-29 ENCOUNTER — Encounter (HOSPITAL_COMMUNITY): Payer: Self-pay

## 2013-01-29 ENCOUNTER — Emergency Department (HOSPITAL_COMMUNITY): Payer: 59

## 2013-01-29 ENCOUNTER — Emergency Department (HOSPITAL_COMMUNITY)
Admission: EM | Admit: 2013-01-29 | Discharge: 2013-01-29 | Disposition: A | Discharge status: Home / Self Care | Payer: 59 | Attending: Emergency Medicine | Admitting: Emergency Medicine

## 2013-01-29 DIAGNOSIS — F17200 Nicotine dependence, unspecified, uncomplicated: Secondary | ICD-10-CM | POA: Insufficient documentation

## 2013-01-29 DIAGNOSIS — Z792 Long term (current) use of antibiotics: Secondary | ICD-10-CM | POA: Insufficient documentation

## 2013-01-29 DIAGNOSIS — J40 Bronchitis, not specified as acute or chronic: Secondary | ICD-10-CM

## 2013-01-29 DIAGNOSIS — Z79899 Other long term (current) drug therapy: Secondary | ICD-10-CM | POA: Insufficient documentation

## 2013-01-29 DIAGNOSIS — IMO0002 Reserved for concepts with insufficient information to code with codable children: Secondary | ICD-10-CM | POA: Insufficient documentation

## 2013-01-29 DIAGNOSIS — J209 Acute bronchitis, unspecified: Secondary | ICD-10-CM | POA: Insufficient documentation

## 2013-01-29 DIAGNOSIS — G8929 Other chronic pain: Secondary | ICD-10-CM | POA: Insufficient documentation

## 2013-01-29 DIAGNOSIS — Z791 Long term (current) use of non-steroidal anti-inflammatories (NSAID): Secondary | ICD-10-CM | POA: Insufficient documentation

## 2013-01-29 DIAGNOSIS — F411 Generalized anxiety disorder: Secondary | ICD-10-CM | POA: Insufficient documentation

## 2013-01-29 HISTORY — DX: Bronchitis, not specified as acute or chronic: J40

## 2013-01-29 MED ORDER — IPRATROPIUM BROMIDE 0.02 % IN SOLN
0.5000 mg | Freq: Once | RESPIRATORY_TRACT | Status: AC
  Administered 2013-01-29: 0.5 mg via RESPIRATORY_TRACT
  Filled 2013-01-29: qty 2.5

## 2013-01-29 MED ORDER — ALBUTEROL SULFATE (5 MG/ML) 0.5% IN NEBU
5.0000 mg | INHALATION_SOLUTION | Freq: Once | RESPIRATORY_TRACT | Status: AC
  Administered 2013-01-29: 5 mg via RESPIRATORY_TRACT
  Filled 2013-01-29: qty 1

## 2013-01-29 MED ORDER — METHYLPREDNISOLONE SODIUM SUCC 125 MG IJ SOLR
125.0000 mg | Freq: Once | INTRAMUSCULAR | Status: AC
  Administered 2013-01-29: 125 mg via INTRAMUSCULAR
  Filled 2013-01-29: qty 2

## 2013-01-29 MED ORDER — TIOTROPIUM BROMIDE MONOHYDRATE 18 MCG IN CAPS: 18.0000 ug | ORAL_CAPSULE | Freq: Every day | RESPIRATORY_TRACT | Status: DC

## 2013-01-29 NOTE — ED Notes (Signed)
Sob and cough since Friday, states he saw dr Gerda Diss and was started on levaquin and prednisone.  Pt states he is not any better, chest is tight.

## 2013-01-29 NOTE — ED Provider Notes (Signed)
CSN: [id number]     Arrival date & time 01/29/13  0023 History   First MD Initiated Contact with Patient 01/29/13 [phone]     Chief Complaint  Patient presents with  . Cough   (Consider location/radiation/quality/duration/timing/severity/associated sxs/prior Treatment) Patient is a 32 y.o. male presenting with cough. The history is provided by the patient (the pt complains of a cough and sob).  Cough Cough characteristics:  Productive Sputum characteristics:  Nondescript Severity:  Moderate Onset quality:  Gradual Timing:  Constant Progression:  Unchanged Associated symptoms: no chest pain, no eye discharge, no headaches and no rash     Past Medical History  Diagnosis Date  . Depression   . Anxiety   . Chronic back pain   . Headache(784.0)   . Bronchitis    Past Surgical History  Procedure Laterality Date  . Back surgery    . Hernia repair     No family history on file. History  Substance Use Topics  . Smoking status: Current Every Day Smoker -- 1.00 packs/day    Types: Cigarettes  . Smokeless tobacco: Not on file  . Alcohol Use: No    Review of Systems  Constitutional: Negative for appetite change and fatigue.  HENT: Negative for congestion, sinus pressure and ear discharge.   Eyes: Negative for discharge.  Respiratory: Positive for cough.   Cardiovascular: Negative for chest pain.  Gastrointestinal: Negative for abdominal pain and diarrhea.  Genitourinary: Negative for frequency and hematuria.  Musculoskeletal: Negative for back pain.  Skin: Negative for rash.  Neurological: Negative for seizures and headaches.  Psychiatric/Behavioral: Negative for hallucinations.    Allergies  Wellbutrin  Home Medications   Current Outpatient Rx  Name  Route  Sig  Dispense  Refill  . albuterol (PROVENTIL HFA;VENTOLIN HFA) 108 (90 BASE) MCG/ACT inhaler   Inhalation   Inhale 2 puffs into the lungs every 6 (six) hours as needed for wheezing.   1 Inhaler   2   .  baclofen (LIORESAL) 10 MG tablet   Oral   Take 10 mg by mouth 4 (four) times daily as needed.         . clonazePAM (KLONOPIN) 1 MG tablet      TAKE 1 TO 1 & 1/2 TABLETS BY MOUTH 30 MINUTES TO 1 HOUR BEFORE BEDTIME.   45 tablet   3     Must last 30 days   . ibuprofen (ADVIL,MOTRIN) 800 MG tablet   Oral   Take 800 mg by mouth every 8 (eight) hours as needed.         Marland Kitchen levofloxacin (LEVAQUIN) 500 MG tablet   Oral   Take 1 tablet (500 mg total) by mouth daily.   10 tablet   0   . predniSONE (DELTASONE) 20 MG tablet      Three qd for three d, two qd for three d, one qd for two d   18 tablet   0   . Diclofenac (ZORVOLEX) 35 MG CAPS   Oral   Take 1 capsule by mouth 3 (three) times daily.         Marland Kitchen HYDROcodone-acetaminophen (NORCO/VICODIN) 5-325 MG per tablet      1 or 2 tabs PO q6 hours prn pain   8 tablet   0   . naproxen (NAPROSYN) 250 MG tablet   Oral   Take 1 tablet (250 mg total) by mouth 2 (two) times daily with a meal.   14 tablet   0   .  tiotropium (SPIRIVA HANDIHALER) 18 MCG inhalation capsule   Inhalation   Place 1 capsule (18 mcg total) into inhaler and inhale daily.   30 capsule   12    BP 150/89  Pulse 96  Temp(Src) 98.8 F (37.1 C) (Oral)  Resp 18  Ht 5\' 9"  (1.753 m)  Wt 221 lb (100.245 kg)  BMI 32.62 kg/m2  SpO2 95% Physical Exam  Constitutional: He is oriented to person, place, and time. He appears well-developed.  HENT:  Head: Normocephalic.  Eyes: Conjunctivae and EOM are normal. No scleral icterus.  Neck: Neck supple. No thyromegaly present.  Cardiovascular: Normal rate and regular rhythm.  Exam reveals no gallop and no friction rub.   No murmur heard. Pulmonary/Chest: No stridor. He has wheezes. He has no rales. He exhibits no tenderness.  Abdominal: He exhibits no distension. There is no tenderness. There is no rebound.  Musculoskeletal: Normal range of motion. He exhibits no edema.  Lymphadenopathy:    He has no cervical  adenopathy.  Neurological: He is oriented to person, place, and time. He exhibits normal muscle tone. Coordination normal.  Skin: No rash noted. No erythema.  Psychiatric: He has a normal mood and affect. His behavior is normal.    ED Course  Procedures (including critical care time) Labs Review Labs Reviewed - No data to display Imaging Review Dg Chest 2 View  01/29/2013   CLINICAL DATA:  Shortness of breath, cough, wheezing.  EXAM: CHEST  2 VIEW  COMPARISON:  01/31/2012  FINDINGS: The heart size and mediastinal contours are within normal limits. Both lungs are clear. The visualized skeletal structures are unremarkable.  Spinal stimulator device noted in place.  IMPRESSION: No active cardiopulmonary disease.   Electronically Signed   By: Charlett Nose M.D.   On: 01/29/2013 01:41    MDM   1. Bronchitis        Benny Lennert, MD 01/29/13 [phone]

## 2013-01-30 ENCOUNTER — Ambulatory Visit (INDEPENDENT_AMBULATORY_CARE_PROVIDER_SITE_OTHER): Payer: 59 | Admitting: Family Medicine

## 2013-01-30 ENCOUNTER — Encounter: Payer: Self-pay | Admitting: Family Medicine

## 2013-01-30 ENCOUNTER — Telehealth: Payer: Self-pay | Admitting: Family Medicine

## 2013-01-30 VITALS — BP 110/82 | Temp 98.2°F | Ht 69.0 in | Wt 223.5 lb

## 2013-01-30 DIAGNOSIS — R062 Wheezing: Secondary | ICD-10-CM

## 2013-01-30 DIAGNOSIS — J209 Acute bronchitis, unspecified: Secondary | ICD-10-CM

## 2013-01-30 MED ORDER — PREDNISONE 20 MG PO TABS: ORAL_TABLET | ORAL | Status: DC

## 2013-01-30 MED ORDER — ALBUTEROL SULFATE (2.5 MG/3ML) 0.083% IN NEBU: 2.5000 mg | INHALATION_SOLUTION | Freq: Four times a day (QID) | RESPIRATORY_TRACT | Status: DC | PRN

## 2013-01-30 NOTE — Telephone Encounter (Signed)
error 

## 2013-01-30 NOTE — Progress Notes (Signed)
  Subjective:    Patient ID: Noah Vasquez, male    DOB: [birth year], 32 y.o.   MRN: [id number]  Wheezing  This is a new problem. The current episode started 1 to 4 weeks ago. The problem occurs intermittently. The problem has been gradually worsening. Associated symptoms include shortness of breath. Associated symptoms comments: Chest tightness. Nothing aggravates the symptoms. He has tried steroid inhaler and oral steroids (antibiotics) for the symptoms. The treatment provided no relief.   no high fevers. No vomiting. Having significant wheezing feel short of breath with activity. No sweats. He did have to go to the ER. Was treated and released. Has a history of reactive airway Patient states he quit smoking a few days ago Review of Systems  Respiratory: Positive for shortness of breath and wheezing.        Objective:   Physical Exam  Bilateral expiratory wheezes nebulizer treatment given some improvement Heart regular neck supple not respiratory distress      Assessment & Plan:   significant inflammation in the lungs patient will finish antibiotics. Also prednisone taper if additional meds or 6 days, to followup if ongoing troubles. Warnings discussed nebulizer treatments every 2-4 hours as needed.  Patient's condition is such to wear he is not capable of working currently. He will be off from September 30 all the way through October 15. His return to work is dependent upon him improving to the point where he can go back to work. Patient understands this.

## 2013-02-12 ENCOUNTER — Encounter: Payer: Self-pay | Admitting: Family Medicine

## 2013-02-12 ENCOUNTER — Ambulatory Visit (INDEPENDENT_AMBULATORY_CARE_PROVIDER_SITE_OTHER): Payer: 59 | Admitting: Family Medicine

## 2013-02-12 VITALS — BP 130/76 | Ht 69.0 in | Wt 227.0 lb

## 2013-02-12 DIAGNOSIS — J209 Acute bronchitis, unspecified: Secondary | ICD-10-CM

## 2013-02-12 NOTE — Progress Notes (Signed)
  Subjective:    Patient ID: Noah Vasquez, male    DOB: 1980/07/27, 32 y.o.   MRN: [id number]  HPIHere to get a release to return to work. Was out of work because of severe lung inflammation. Pt states his job will fax over forms later today to fill out.  Patient states overall he's feeling much improved he is able to move around better he is working on quitting smoking he 2 cigarettes a day he denies any chest tightness pressure pain shortness of breath currently. Declines flu vaccine. Patient was counseled to do a flu vaccine but he defers PMH benign Family history benign Review of Systems See above    Objective:   Physical Exam  Lungs are clear hearts regular pulse normal extremities no edema eardrums normal      Assessment & Plan:  The patient actually is doing much better than what he was. Lungs sound much better he is down to 2 cigarettes per day I encouraged him to told to quit smoking. He is back to his baseline he is capable of returning to work. He was released to work for October 16 full duty without restrictions he will followup if any problems. Bronchitis/walking remote pneumonia -- resolved Flu vaccine was advised patient deferred

## 2013-03-06 ENCOUNTER — Other Ambulatory Visit: Payer: Self-pay

## 2013-05-20 ENCOUNTER — Other Ambulatory Visit: Payer: Self-pay | Admitting: Family Medicine

## 2013-06-10 NOTE — Telephone Encounter (Signed)
Ok plus 2 monthly ref 

## 2013-07-09 ENCOUNTER — Other Ambulatory Visit (HOSPITAL_COMMUNITY): Payer: Self-pay | Admitting: Physical Medicine and Rehabilitation

## 2013-07-09 DIAGNOSIS — M545 Low back pain, unspecified: Secondary | ICD-10-CM

## 2013-07-11 ENCOUNTER — Ambulatory Visit (HOSPITAL_COMMUNITY)
Admission: RE | Admit: 2013-07-11 | Discharge: 2013-07-11 | Disposition: A | Discharge status: Home / Self Care | Payer: 59 | Source: Ambulatory Visit | Attending: Physical Medicine and Rehabilitation | Admitting: Physical Medicine and Rehabilitation

## 2013-07-11 ENCOUNTER — Encounter (HOSPITAL_COMMUNITY): Payer: Self-pay

## 2013-07-11 DIAGNOSIS — M545 Low back pain, unspecified: Secondary | ICD-10-CM | POA: Insufficient documentation

## 2013-07-11 DIAGNOSIS — M47817 Spondylosis without myelopathy or radiculopathy, lumbosacral region: Secondary | ICD-10-CM | POA: Insufficient documentation

## 2013-07-11 MED ORDER — GADOBENATE DIMEGLUMINE 529 MG/ML IV SOLN
20.0000 mL | Freq: Once | INTRAVENOUS | Status: AC | PRN
  Administered 2013-07-11: 20 mL via INTRAVENOUS

## 2014-01-26 ENCOUNTER — Encounter: Payer: Self-pay | Admitting: Family Medicine

## 2014-01-26 ENCOUNTER — Ambulatory Visit (INDEPENDENT_AMBULATORY_CARE_PROVIDER_SITE_OTHER): Payer: 59 | Admitting: Family Medicine

## 2014-01-26 VITALS — BP 124/84 | Temp 98.4°F | Ht 69.0 in | Wt 236.0 lb

## 2014-01-26 DIAGNOSIS — J329 Chronic sinusitis, unspecified: Secondary | ICD-10-CM

## 2014-01-26 MED ORDER — PREDNISONE 20 MG PO TABS: ORAL_TABLET | ORAL | Status: AC

## 2014-01-26 MED ORDER — CLARITHROMYCIN 500 MG PO TABS: 500.0000 mg | ORAL_TABLET | Freq: Two times a day (BID) | ORAL | Status: AC

## 2014-01-26 NOTE — Progress Notes (Signed)
   Subjective:    Patient ID: Noah Vasquez, male    DOB: 24-Aug-1980, 33 y.o.   MRN: [id number]  Cough This is a new problem. The current episode started in the past 7 days. Associated symptoms include headaches, nasal congestion and wheezing. He has tried steroid inhaler for the symptoms.   Out of work  Uses inhaler only prn, twice per d, uses prn,  Still smoking  Highest temp mone yet  Headache frontal in nature   Review of Systems  Respiratory: Positive for cough and wheezing.   Neurological: Positive for headaches.       Objective:   Physical Exam  Alert mild malaise. H&T moderate nasal congestion. Frontal tenderness pharynx slight erythema neck supple. Lungs clear heart regular in rhythm.      Assessment & Plan:  Impression sinusitis/bronchitis plan antibiotics prescribed. Symptomatic care discussed. WSL

## 2014-09-02 ENCOUNTER — Encounter: Payer: Self-pay | Admitting: Family Medicine

## 2014-09-02 ENCOUNTER — Ambulatory Visit (INDEPENDENT_AMBULATORY_CARE_PROVIDER_SITE_OTHER): Payer: 59 | Admitting: Family Medicine

## 2014-09-02 VITALS — BP 122/80 | Ht 70.5 in | Wt 229.0 lb

## 2014-09-02 DIAGNOSIS — R739 Hyperglycemia, unspecified: Secondary | ICD-10-CM | POA: Diagnosis not present

## 2014-09-02 DIAGNOSIS — Z0000 Encounter for general adult medical examination without abnormal findings: Secondary | ICD-10-CM

## 2014-09-02 DIAGNOSIS — E785 Hyperlipidemia, unspecified: Secondary | ICD-10-CM | POA: Diagnosis not present

## 2014-09-02 NOTE — Progress Notes (Signed)
   Subjective:    Patient ID: Noah Vasquez, male    DOB: Sep 11, 1980, 34 y.o.   MRN: [id number]  HPI The patient comes in today for a wellness visit.  Pt brought in copy of bloodwork done at work.   A review of their health history was completed.  A review of medications was also completed.  Any needed refills; none. Not taking any meds prescribed at this office  Eating habits: not health conscious  Falls/  MVA accidents in past few months: none  Regular exercise: walks mostly at work. Works 12 hour shifts  Specialist pt sees on regular basis: yes pain management. Dr. Eduard ClosBethea  Preventative health issues were discussed.   Additional concerns: left knee pain. Feels like something is rubbing in knee when he is walking. Started 6 months or more ago.   Rash on hands. Off and on for the past 2 years.   Concerns about diabetes. Pt brought in bloodwork done at work. His blood sugar was 95 and he had coffee with cream and sugar. Pt states he gets weak and shaky if he doesn't eat in the am or if he waits to long to eat.   Main exercise is walking a couple times per week  Left knee pain the past month, recalls no injury,  Feels a rubbing sensation  Small rash on hands, aggrvating by the appearance  Fa had heart atack  Mo had heart attack    Review of Systems No headache no chest pain no weight loss no weight gain no abdominal pain no change in bowel habits no blood in stool knee pain is noted. Rash on hands. No rash elsewhere. Decent appetite no change in urinary symptoms.    Objective:   Physical Exam  Constitutional: He appears well-developed and well-nourished.  HENT:  Head: Normocephalic and atraumatic.  Right Ear: External ear normal.  Left Ear: External ear normal.  Nose: Nose normal.  Mouth/Throat: Oropharynx is clear and moist.  Eyes: EOM are normal. Pupils are equal, round, and reactive to light.  Neck: Normal range of motion. Neck supple. No thyromegaly present.    Cardiovascular: Normal rate, regular rhythm and normal heart sounds.   No murmur heard. Pulmonary/Chest: Effort normal and breath sounds normal. No respiratory distress. He has no wheezes.  Abdominal: Soft. Bowel sounds are normal. He exhibits no distension and no mass. There is no tenderness.  Genitourinary: Penis normal.  Musculoskeletal: Normal range of motion. He exhibits no edema.  Lymphadenopathy:    He has no cervical adenopathy.  Neurological: He is alert. He exhibits normal muscle tone.  Skin: Skin is warm and dry. No erythema.  Psychiatric: He has a normal mood and affect. His behavior is normal. Judgment normal.  Vitals reviewed.         Assessment & Plan:  Impression wellness exam #2 left knee pain discuss nonspecific #3 hyperlipidemia patient brings in blood work obtained elsewhere through [employer]. LDL was seriously high discussed at length #4 diabetes concerned due to increased thirst plan appropriate blood work. Follow-up as scheduled. WSL

## 2014-09-04 ENCOUNTER — Encounter: Payer: Self-pay | Admitting: Family Medicine

## 2014-09-04 DIAGNOSIS — E785 Hyperlipidemia, unspecified: Secondary | ICD-10-CM | POA: Insufficient documentation

## 2014-09-08 LAB — BASIC METABOLIC PANEL
BUN/Creatinine Ratio: 11 (ref 8–19)
BUN: 12 mg/dL (ref 6–20)
CHLORIDE: 103 mmol/L (ref 97–108)
CO2: 24 mmol/L (ref 18–29)
Calcium: 9.6 mg/dL (ref 8.7–10.2)
Creatinine, Ser: 1.08 mg/dL (ref 0.76–1.27)
GFR calc non Af Amer: 90 mL/min/{1.73_m2} (ref >59)
GFR, EST AFRICAN AMERICAN: 104 mL/min/{1.73_m2} (ref >59)
GLUCOSE: 89 mg/dL (ref 65–99)
POTASSIUM: 4.7 mmol/L (ref 3.5–5.2)
Sodium: 142 mmol/L (ref 134–144)

## 2014-09-08 LAB — LIPID PANEL
CHOLESTEROL TOTAL: 260 mg/dL — AB (ref 100–199)
Chol/HDL Ratio: 7.6 ratio units — ABNORMAL HIGH (ref 0.0–5.0)
HDL: 34 mg/dL — ABNORMAL LOW (ref >39)
LDL CALC: 194 mg/dL — AB (ref 0–99)
TRIGLYCERIDES: 158 mg/dL — AB (ref 0–149)
VLDL CHOLESTEROL CAL: 32 mg/dL (ref 5–40)

## 2014-09-08 LAB — HEMOGLOBIN A1C
ESTIMATED AVERAGE GLUCOSE: 108 mg/dL
Hgb A1c MFr Bld: 5.4 % (ref 4.8–5.6)

## 2014-09-08 LAB — HEPATIC FUNCTION PANEL
ALBUMIN: 4.3 g/dL (ref 3.5–5.5)
ALK PHOS: 83 IU/L (ref 39–117)
ALT: 13 IU/L (ref 0–44)
AST: 13 IU/L (ref 0–40)
Bilirubin Total: 0.6 mg/dL (ref 0.0–1.2)
Bilirubin, Direct: 0.13 mg/dL (ref 0.00–0.40)
Total Protein: 6.4 g/dL (ref 6.0–8.5)

## 2014-09-16 ENCOUNTER — Ambulatory Visit (INDEPENDENT_AMBULATORY_CARE_PROVIDER_SITE_OTHER): Payer: 59 | Admitting: Family Medicine

## 2014-09-16 ENCOUNTER — Encounter (HOSPITAL_COMMUNITY): Payer: Self-pay | Admitting: Emergency Medicine

## 2014-09-16 ENCOUNTER — Emergency Department (HOSPITAL_COMMUNITY)
Admission: EM | Admit: 2014-09-16 | Discharge: 2014-09-17 | Disposition: A | Discharge status: Home / Self Care | Payer: 59 | Attending: Emergency Medicine | Admitting: Emergency Medicine

## 2014-09-16 ENCOUNTER — Encounter: Payer: Self-pay | Admitting: Family Medicine

## 2014-09-16 VITALS — BP 122/80 | Ht 70.5 in | Wt 229.6 lb

## 2014-09-16 DIAGNOSIS — H578 Other specified disorders of eye and adnexa: Secondary | ICD-10-CM | POA: Diagnosis present

## 2014-09-16 DIAGNOSIS — Z8709 Personal history of other diseases of the respiratory system: Secondary | ICD-10-CM | POA: Insufficient documentation

## 2014-09-16 DIAGNOSIS — G8929 Other chronic pain: Secondary | ICD-10-CM | POA: Insufficient documentation

## 2014-09-16 DIAGNOSIS — Z8659 Personal history of other mental and behavioral disorders: Secondary | ICD-10-CM | POA: Diagnosis not present

## 2014-09-16 DIAGNOSIS — Z23 Encounter for immunization: Secondary | ICD-10-CM | POA: Insufficient documentation

## 2014-09-16 DIAGNOSIS — J3489 Other specified disorders of nose and nasal sinuses: Secondary | ICD-10-CM | POA: Insufficient documentation

## 2014-09-16 DIAGNOSIS — E785 Hyperlipidemia, unspecified: Secondary | ICD-10-CM | POA: Diagnosis not present

## 2014-09-16 DIAGNOSIS — Z79899 Other long term (current) drug therapy: Secondary | ICD-10-CM

## 2014-09-16 DIAGNOSIS — Z720 Tobacco use: Secondary | ICD-10-CM | POA: Diagnosis not present

## 2014-09-16 DIAGNOSIS — H18821 Corneal disorder due to contact lens, right eye: Secondary | ICD-10-CM | POA: Diagnosis not present

## 2014-09-16 MED ORDER — FLUORESCEIN SODIUM 1 MG OP STRP
1.0000 | ORAL_STRIP | Freq: Once | OPHTHALMIC | Status: AC
  Administered 2014-09-16: 1 via OPHTHALMIC
  Filled 2014-09-16: qty 1

## 2014-09-16 MED ORDER — TETRACAINE HCL 0.5 % OP SOLN
1.0000 [drp] | Freq: Once | OPHTHALMIC | Status: AC
  Administered 2014-09-16: 1 [drp] via OPHTHALMIC
  Filled 2014-09-16: qty 2

## 2014-09-16 MED ORDER — ERYTHROMYCIN 5 MG/GM OP OINT
TOPICAL_OINTMENT | Freq: Once | OPHTHALMIC | Status: AC
  Administered 2014-09-16: 1 via OPHTHALMIC
  Filled 2014-09-16: qty 3.5

## 2014-09-16 MED ORDER — ATORVASTATIN CALCIUM 20 MG PO TABS: 20.0000 mg | ORAL_TABLET | Freq: Every day | ORAL | Status: DC

## 2014-09-16 MED ORDER — TETANUS-DIPHTH-ACELL PERTUSSIS 5-2.5-18.5 LF-MCG/0.5 IM SUSP
0.5000 mL | Freq: Once | INTRAMUSCULAR | Status: AC
  Administered 2014-09-16: 0.5 mL via INTRAMUSCULAR
  Filled 2014-09-16: qty 0.5

## 2014-09-16 MED ORDER — KETOROLAC TROMETHAMINE 0.5 % OP SOLN
1.0000 [drp] | Freq: Once | OPHTHALMIC | Status: AC
  Administered 2014-09-16: 1 [drp] via OPHTHALMIC
  Filled 2014-09-16: qty 5

## 2014-09-16 NOTE — Progress Notes (Signed)
   Subjective:    Patient ID: Noah CanalesJamie A Vasquez, male    DOB: 09/07/1980, 34 y.o.   MRN: [id number]  Hyperlipidemia This is a chronic problem.  Follow up on bloodwork. BW done on 09/07/14.  Pt states no concerns today.   Results for orders placed or performed in visit on 09/02/14  Lipid panel  Result Value Ref Range   Cholesterol, Total 260 (H) 100 - 199 mg/dL   Triglycerides 782158 (H) 0 - 149 mg/dL   HDL 34 (L) >95>39 mg/dL   VLDL Cholesterol Cal 32 5 - 40 mg/dL   LDL Calculated 621194 (H) 0 - 99 mg/dL   Comment: Comment    Chol/HDL Ratio 7.6 (H) 0.0 - 5.0 ratio units  Hepatic function panel  Result Value Ref Range   Total Protein 6.4 6.0 - 8.5 g/dL   Albumin 4.3 3.5 - 5.5 g/dL   Bilirubin Total 0.6 0.0 - 1.2 mg/dL   Bilirubin, Direct 3.080.13 0.00 - 0.40 mg/dL   Alkaline Phosphatase 83 39 - 117 IU/L   AST 13 0 - 40 IU/L   ALT 13 0 - 44 IU/L  Basic metabolic panel  Result Value Ref Range   Glucose 89 65 - 99 mg/dL   BUN 12 6 - 20 mg/dL   Creatinine, Ser 6.571.08 0.76 - 1.27 mg/dL   GFR calc non Af Amer 90 >59 mL/min/1.73   GFR calc Af Amer 104 >59 mL/min/1.73   BUN/Creatinine Ratio 11 8 - 19   Sodium 142 134 - 144 mmol/L   Potassium 4.7 3.5 - 5.2 mmol/L   Chloride 103 97 - 108 mmol/L   CO2 24 18 - 29 mmol/L   Calcium 9.6 8.7 - 10.2 mg/dL  Hemoglobin Q4OA1c  Result Value Ref Range   Hgb A1c MFr Bld 5.4 4.8 - 5.6 %   Est. average glucose Bld gHb Est-mCnc 108 mg/dL    Both families have heart troubles Review of Systems No headache no chest pain no back pain no abdominal pain    Objective:   Physical Exam  Alert no acute distress vital stable lungs clear heart rare rhythm ankles edema      Assessment & Plan:  Impression hyperlipidemia LDL dangerously high. Discussed at length. Particularly in light of other risk factors and family history plan initiate generic Lipitor. Diet exercise discussed. Recheck in several months. Blood work before then. WSL

## 2014-09-16 NOTE — Discharge Instructions (Signed)
Corneal Abrasion The cornea is the clear covering at the front and center of the eye. When looking at the colored portion of the eye (iris), you are looking through the cornea. This very thin tissue is made up of many layers. The surface layer is a single layer of cells (corneal epithelium) and is one of the most sensitive tissues in the body. If a scratch or injury causes the corneal epithelium to come off, it is called a corneal abrasion. If the injury extends to the tissues below the epithelium, the condition is called a corneal ulcer. CAUSES   Scratches.  Trauma.  Foreign body in the eye. Some people have recurrences of abrasions in the area of the original injury even after it has healed (recurrent erosion syndrome). Recurrent erosion syndrome generally improves and goes away with time. SYMPTOMS   Eye pain.  Difficulty or inability to keep the injured eye open.  The eye becomes very sensitive to light.  Recurrent erosions tend to happen suddenly, first thing in the morning, usually after waking up and opening the eye. DIAGNOSIS  Your health care provider can diagnose a corneal abrasion during an eye exam. Dye is usually placed in the eye using a drop or a small paper strip moistened by your tears. When the eye is examined with a special light, the abrasion shows up clearly because of the dye. TREATMENT   Small abrasions may be treated with antibiotic drops or ointment alone.  A pressure patch may be put over the eye. If this is done, follow your doctor's instructions for when to remove the patch. Do not drive or use machines while the eye patch is on. Judging distances is hard to do with a patch on. If the abrasion becomes infected and spreads to the deeper tissues of the cornea, a corneal ulcer can result. This is serious because it can cause corneal scarring. Corneal scars interfere with light passing through the cornea and cause a loss of vision in the involved eye. HOME CARE  INSTRUCTIONS  Use medicine or ointment as directed. Only take over-the-counter or prescription medicines for pain, discomfort, or fever as directed by your health care provider.  Do not drive or operate machinery if your eye is patched. Your ability to judge distances is impaired.  If your health care provider has given you a follow-up appointment, it is very important to keep that appointment. Not keeping the appointment could result in a severe eye infection or permanent loss of vision. If there is any problem keeping the appointment, let your health care provider know. SEEK MEDICAL CARE IF:   You have pain, light sensitivity, and a scratchy feeling in one eye or both eyes.  Your pressure patch keeps loosening up, and you can blink your eye under the patch after treatment.  Any kind of discharge develops from the eye after treatment or if the lids stick together in the morning.  You have the same symptoms in the morning as you did with the original abrasion days, weeks, or months after the abrasion healed. MAKE SURE YOU:   Understand these instructions.  Will watch your condition.  Will get help right away if you are not doing well or get worse. Document Released: 04/14/2000 Document Revised: 04/22/2013 Document Reviewed: 12/23/2012 [facility] Patient Information 2015 [employer] [place]. This information is not intended to replace advice given to you by your health care provider. Make sure you discuss any questions you have with your health care provider.   Do  not wear your contact lenses until your eye doctor tells you to.  Apply one ketorolac drop in your eye every 6 hours, followed by a small ribbon of the antibiotic ointment.  This injury should take several days to heal,  As these abrasions usually heal quickly.  Followup with your ophthalmologist as discussed.

## 2014-09-16 NOTE — Patient Instructions (Signed)
Results for orders placed or performed in visit on 09/02/14  Lipid panel  Result Value Ref Range   Cholesterol, Total 260 (H) 100 - 199 mg/dL   Triglycerides 409158 (H) 0 - 149 mg/dL   HDL 34 (L) >81>39 mg/dL   VLDL Cholesterol Cal 32 5 - 40 mg/dL   LDL Calculated 191194 (H) 0 - 99 mg/dL   Comment: Comment    Chol/HDL Ratio 7.6 (H) 0.0 - 5.0 ratio units  Hepatic function panel  Result Value Ref Range   Total Protein 6.4 6.0 - 8.5 g/dL   Albumin 4.3 3.5 - 5.5 g/dL   Bilirubin Total 0.6 0.0 - 1.2 mg/dL   Bilirubin, Direct 4.780.13 0.00 - 0.40 mg/dL   Alkaline Phosphatase 83 39 - 117 IU/L   AST 13 0 - 40 IU/L   ALT 13 0 - 44 IU/L  Basic metabolic panel  Result Value Ref Range   Glucose 89 65 - 99 mg/dL   BUN 12 6 - 20 mg/dL   Creatinine, Ser 2.951.08 0.76 - 1.27 mg/dL   GFR calc non Af Amer 90 >59 mL/min/1.73   GFR calc Af Amer 104 >59 mL/min/1.73   BUN/Creatinine Ratio 11 8 - 19   Sodium 142 134 - 144 mmol/L   Potassium 4.7 3.5 - 5.2 mmol/L   Chloride 103 97 - 108 mmol/L   CO2 24 18 - 29 mmol/L   Calcium 9.6 8.7 - 10.2 mg/dL  Hemoglobin A2ZA1c  Result Value Ref Range   Hgb A1c MFr Bld 5.4 4.8 - 5.6 %   Est. average glucose Bld gHb Est-mCnc 108 mg/dL   Go to [facility] and look up low fat diet and take it to heart    Fat and Cholesterol Control Diet Fat and cholesterol levels in your blood and organs are influenced by your diet. High levels of fat and cholesterol may lead to diseases of the heart, small and large blood vessels, gallbladder, liver, and pancreas. CONTROLLING FAT AND CHOLESTEROL WITH DIET Although exercise and lifestyle factors are important, your diet is key. That is because certain foods are known to raise cholesterol and others to lower it. The goal is to balance foods for their effect on cholesterol and more importantly, to replace saturated and trans fat with other types of fat, such as monounsaturated fat, polyunsaturated fat, and omega-3 fatty acids. On average, a person should  consume no more than 15 to 17 g of saturated fat daily. Saturated and trans fats are considered "bad" fats, and they will raise LDL cholesterol. Saturated fats are primarily found in animal products such as meats, butter, and cream. However, that does not mean you need to give up all your favorite foods. Today, there are good tasting, low-fat, low-cholesterol substitutes for most of the things you like to eat. Choose low-fat or nonfat alternatives. Choose round or loin cuts of red meat. These types of cuts are lowest in fat and cholesterol. Chicken (without the skin), fish, veal, and ground [place] breast are great choices. Eliminate fatty meats, such as hot dogs and salami. Even shellfish have little or no saturated fat. Have a 3 oz (85 g) portion when you eat lean meat, poultry, or fish. Trans fats are also called "partially hydrogenated oils." They are oils that have been scientifically manipulated so that they are solid at room temperature resulting in a longer shelf life and improved taste and texture of foods in which they are added. Trans fats are found in stick  margarine, some tub margarines, cookies, crackers, and baked goods.  When baking and cooking, oils are a great substitute for butter. The monounsaturated oils are especially beneficial since it is believed they lower LDL and raise HDL. The oils you should avoid entirely are saturated tropical oils, such as coconut and palm.  Remember to eat a lot from food groups that are naturally free of saturated and trans fat, including fish, fruit, vegetables, beans, grains (barley, rice, couscous, bulgur wheat), and pasta (without cream sauces).  IDENTIFYING FOODS THAT LOWER FAT AND CHOLESTEROL  Soluble fiber may lower your cholesterol. This type of fiber is found in fruits such as apples, vegetables such as broccoli, potatoes, and carrots, legumes such as beans, peas, and lentils, and grains such as barley. Foods fortified with plant sterols (phytosterol)  may also lower cholesterol. You should eat at least 2 g per day of these foods for a cholesterol lowering effect.  Read package labels to identify low-saturated fats, trans fat free, and low-fat foods at the supermarket. Select cheeses that have only 2 to 3 g saturated fat per ounce. Use a heart-healthy tub margarine that is free of trans fats or partially hydrogenated oil. When buying baked goods (cookies, crackers), avoid partially hydrogenated oils. Breads and muffins should be made from whole grains (whole-wheat or whole oat flour, instead of "flour" or "enriched flour"). Buy non-creamy canned soups with reduced salt and no added fats.  FOOD PREPARATION TECHNIQUES  Never deep-fry. If you must fry, either stir-fry, which uses very little fat, or use non-stick cooking sprays. When possible, broil, bake, or roast meats, and steam vegetables. Instead of putting butter or margarine on vegetables, use lemon and herbs, applesauce, and cinnamon (for squash and sweet potatoes). Use nonfat yogurt, salsa, and low-fat dressings for salads.  LOW-SATURATED FAT / LOW-FAT FOOD SUBSTITUTES Meats / Saturated Fat (g)  Avoid: Steak, marbled (3 oz/85 g) / 11 g  Choose: Steak, lean (3 oz/85 g) / 4 g  Avoid: Hamburger (3 oz/85 g) / 7 g  Choose: Hamburger, lean (3 oz/85 g) / 5 g  Avoid: Ham (3 oz/85 g) / 6 g  Choose: Ham, lean cut (3 oz/85 g) / 2.4 g  Avoid: Chicken, with skin, dark meat (3 oz/85 g) / 4 g  Choose: Chicken, skin removed, dark meat (3 oz/85 g) / 2 g  Avoid: Chicken, with skin, light meat (3 oz/85 g) / 2.5 g  Choose: Chicken, skin removed, light meat (3 oz/85 g) / 1 g Dairy / Saturated Fat (g)  Avoid: Whole milk (1 cup) / 5 g  Choose: Low-fat milk, 2% (1 cup) / 3 g  Choose: Low-fat milk, 1% (1 cup) / 1.5 g  Choose: Skim milk (1 cup) / 0.3 g  Avoid: Hard cheese (1 oz/28 g) / 6 g  Choose: Skim milk cheese (1 oz/28 g) / 2 to 3 g  Avoid: Cottage cheese, 4% fat (1 cup) / 6.5 g  Choose:  Low-fat cottage cheese, 1% fat (1 cup) / 1.5 g  Avoid: Ice cream (1 cup) / 9 g  Choose: Sherbet (1 cup) / 2.5 g  Choose: Nonfat frozen yogurt (1 cup) / 0.3 g  Choose: Frozen fruit bar / trace  Avoid: Whipped cream (1 tbs) / 3.5 g  Choose: Nondairy whipped topping (1 tbs) / 1 g Condiments / Saturated Fat (g)  Avoid: Mayonnaise (1 tbs) / 2 g  Choose: Low-fat mayonnaise (1 tbs) / 1 g  Avoid: Butter (1 tbs) /  7 g  Choose: Extra light margarine (1 tbs) / 1 g  Avoid: Coconut oil (1 tbs) / 11.8 g  Choose: Olive oil (1 tbs) / 1.8 g  Choose: Corn oil (1 tbs) / 1.7 g  Choose: Safflower oil (1 tbs) / 1.2 g  Choose: Sunflower oil (1 tbs) / 1.4 g  Choose: Soybean oil (1 tbs) / 2.4 g  Choose: Canola oil (1 tbs) / 1 g Document Released: 04/17/2005 Document Revised: 08/12/2012 Document Reviewed: 07/16/2013 Exit[employer] Patient Information 2015 [employer] [employer] [place]. This information is not intended to replace advice given to you by your health care provider. Make sure you discuss any questions you have with your health care provider.

## 2014-09-16 NOTE — ED Notes (Signed)
Pt c/o rt eye irritation that started today.

## 2014-09-16 NOTE — ED Notes (Signed)
Patient was unable to complete vision test with glasses, completed test both eyes are blurring, starting having issues 30 minutes after taking out contacts.

## 2014-09-18 NOTE — ED Provider Notes (Signed)
CSN: [id number]     Arrival date & time 09/16/14  2157 History   First MD Initiated Contact with Patient 09/16/14 2206     Chief Complaint  Patient presents with  . Eye Problem     (Consider location/radiation/quality/duration/timing/severity/associated sxs/prior Treatment) Patient is a 34 y.o. male presenting with eye problem. The history is provided by the patient.  Eye Problem Location:  R eye Quality:  Foreign body sensation Severity:  Moderate Onset quality:  Gradual (symptoms started about 30 minutes after taking contacts out several hours before arrival. He has been wearing his eyeglasses since.  Extended wear contacts, works in dusty environment.) Duration:  2 hours Timing:  Constant Progression:  Unchanged Chronicity:  New Context: contact lenses   Context comment:  Unknown possible foreign body. Relieved by:  Closing eye Worsened by:  Bright light and eye movement (blinking) Ineffective treatments:  None tried Associated symptoms: foreign body sensation, photophobia and redness   Associated symptoms: no discharge, no double vision, no headaches, no itching and no swelling     Past Medical History  Diagnosis Date  . Depression   . Anxiety   . Chronic back pain   . Headache(784.0)   . Bronchitis    Past Surgical History  Procedure Laterality Date  . Back surgery    . Hernia repair     No family history on file. History  Substance Use Topics  . Smoking status: Current Every Day Smoker -- 0.50 packs/day    Types: Cigarettes  . Smokeless tobacco: Not on file  . Alcohol Use: No    Review of Systems  Constitutional: Negative for fever and chills.  HENT: Positive for rhinorrhea and sore throat. Negative for congestion, ear pain, sinus pressure, trouble swallowing and voice change.   Eyes: Positive for photophobia and redness. Negative for double vision, discharge and itching.  Respiratory: Negative for cough, shortness of breath, wheezing and stridor.    Cardiovascular: Negative for chest pain.  Gastrointestinal: Negative for abdominal pain.  Genitourinary: Negative.   Neurological: Negative for headaches.      Allergies  Wellbutrin  Home Medications   Prior to Admission medications   Medication Sig Start Date End Date Taking? Authorizing Provider  albuterol (PROVENTIL HFA;VENTOLIN HFA) 108 (90 BASE) MCG/ACT inhaler Inhale 2 puffs into the lungs every 6 (six) hours as needed for wheezing. 01/24/13   Merlyn AlbertWilliam S Luking, MD  atorvastatin (LIPITOR) 20 MG tablet Take 1 tablet (20 mg total) by mouth at bedtime. 09/16/14   Merlyn AlbertWilliam S Luking, MD  HYDROcodone-acetaminophen [facility](NORCO) 10-325 MG per tablet  08/08/14   Historical Provider, MD  Naproxen-Esomeprazole (VIMOVO) 500-20 MG TBEC Take by mouth.    Historical Provider, MD  tiZANidine (ZANAFLEX) 2 MG tablet Take by mouth every 6 (six) hours as needed for muscle spasms.    Historical Provider, MD   BP 123/86 mmHg  Pulse 64  Temp(Src) 98.6 F (37 C) (Oral)  Resp 20  Ht 5\' 9"  (1.753 m)  Wt 229 lb (103.874 kg)  BMI 33.80 kg/m2  SpO2 100% Physical Exam  Constitutional: He is oriented to person, place, and time. He appears well-developed and well-nourished.  HENT:  Head: Normocephalic and atraumatic.  Right Ear: Tympanic membrane and ear canal normal.  Left Ear: Tympanic membrane and ear canal normal.  Nose: Rhinorrhea present. No mucosal edema.  Mouth/Throat: Uvula is midline, oropharynx is clear and moist and mucous membranes are normal. No oropharyngeal exudate, posterior oropharyngeal edema, posterior oropharyngeal erythema or tonsillar  abscesses.  Eyes: EOM are normal. Pupils are equal, round, and reactive to light. Lids are everted and swept, no foreign bodies found. No foreign body present in the right eye. Right conjunctiva is injected.  Slit lamp exam:      The right eye shows corneal abrasion and fluorescein uptake. The right eye shows no anterior chamber bulge.  Small stippled  abrasion at the 7 oclock position right eye  Bilateral Distance: 20/100 ; R Distance: 20/200 ; L Distance: 20/200 uncorrected  Cardiovascular: Normal rate.   Pulmonary/Chest: Effort normal. No respiratory distress.  Musculoskeletal: Normal range of motion.  Neurological: He is alert and oriented to person, place, and time.  Skin: Skin is warm and dry. No rash noted.  Psychiatric: He has a normal mood and affect.    ED Course  Procedures (including critical care time) Labs Review Labs Reviewed - No data to display  Imaging Review No results found.   EKG Interpretation None      MDM   Final diagnoses:  Corneal abrasion due to contact lens, right    Pt with small corneal abrasion, no fb appreciated, suspect from contact lenses.  Advised to wear glasses only until advised safe to return to contacts by ophthalmologist. Ketorolac drops, erythromycin ointment given.  Tetanus updated. Advised to f/u with pcp within the next several days for recheck.  The patient appears reasonably screened and/or stabilized for discharge and I doubt any other medical condition or other [facility] requiring further screening, evaluation, or treatment in the ED at this time prior to discharge.     Burgess AmorJulie Deval Mroczka, PA-C 09/18/14 1449  Bethann BerkshireJoseph Zammit, MD 09/19/14 [phone]

## 2014-12-05 ENCOUNTER — Emergency Department (HOSPITAL_COMMUNITY)
Admission: EM | Admit: 2014-12-05 | Discharge: 2014-12-05 | Disposition: A | Discharge status: Home / Self Care | Payer: 59 | Attending: Emergency Medicine | Admitting: Emergency Medicine

## 2014-12-05 ENCOUNTER — Emergency Department (HOSPITAL_COMMUNITY): Payer: 59

## 2014-12-05 ENCOUNTER — Encounter (HOSPITAL_COMMUNITY): Payer: Self-pay | Admitting: *Deleted

## 2014-12-05 DIAGNOSIS — S59901A Unspecified injury of right elbow, initial encounter: Secondary | ICD-10-CM | POA: Diagnosis not present

## 2014-12-05 DIAGNOSIS — Z8709 Personal history of other diseases of the respiratory system: Secondary | ICD-10-CM | POA: Insufficient documentation

## 2014-12-05 DIAGNOSIS — Y998 Other external cause status: Secondary | ICD-10-CM | POA: Diagnosis not present

## 2014-12-05 DIAGNOSIS — G8929 Other chronic pain: Secondary | ICD-10-CM | POA: Diagnosis not present

## 2014-12-05 DIAGNOSIS — Z8659 Personal history of other mental and behavioral disorders: Secondary | ICD-10-CM | POA: Insufficient documentation

## 2014-12-05 DIAGNOSIS — X58XXXA Exposure to other specified factors, initial encounter: Secondary | ICD-10-CM | POA: Diagnosis not present

## 2014-12-05 DIAGNOSIS — Z720 Tobacco use: Secondary | ICD-10-CM | POA: Diagnosis not present

## 2014-12-05 DIAGNOSIS — S4991XA Unspecified injury of right shoulder and upper arm, initial encounter: Secondary | ICD-10-CM | POA: Diagnosis present

## 2014-12-05 DIAGNOSIS — M25521 Pain in right elbow: Secondary | ICD-10-CM

## 2014-12-05 DIAGNOSIS — Y9289 Other specified places as the place of occurrence of the external cause: Secondary | ICD-10-CM | POA: Diagnosis not present

## 2014-12-05 DIAGNOSIS — S43401A Unspecified sprain of right shoulder joint, initial encounter: Secondary | ICD-10-CM | POA: Diagnosis not present

## 2014-12-05 DIAGNOSIS — Z79899 Other long term (current) drug therapy: Secondary | ICD-10-CM | POA: Diagnosis not present

## 2014-12-05 DIAGNOSIS — Y9389 Activity, other specified: Secondary | ICD-10-CM | POA: Diagnosis not present

## 2014-12-05 MED ORDER — PREDNISONE 10 MG PO TABS: ORAL_TABLET | ORAL | Status: DC

## 2014-12-05 MED ORDER — OXYCODONE-ACETAMINOPHEN 7.5-325 MG PO TABS: 1.0000 | ORAL_TABLET | Freq: Four times a day (QID) | ORAL | Status: DC | PRN

## 2014-12-05 NOTE — Discharge Instructions (Signed)
Shoulder Sprain °A shoulder sprain is the result of damage to the tough, fiber-like tissues (ligaments) that help hold your shoulder in place. The ligaments may be stretched or torn. Besides the main shoulder joint (the ball and socket), there are several smaller joints that connect the bones in this area. A sprain usually involves one of those joints. Most often it is the acromioclavicular (or AC) joint. That is the joint that connects the collarbone (clavicle) and the shoulder blade (scapula) at the top point of the shoulder blade (acromion). °A shoulder sprain is a mild form of what is called a shoulder separation. Recovering from a shoulder sprain may take some time. For some, pain lingers for several months. Most people recover without long term problems. °CAUSES  °· A shoulder sprain is usually caused by some kind of trauma. This might be: °¨ Falling on an outstretched arm. °¨ Being hit hard on the shoulder. °¨ Twisting the arm. °· Shoulder sprains are more likely to occur in people who: °¨ Play sports. °¨ Have balance or coordination problems. °SYMPTOMS  °· Pain when you move your shoulder. °· Limited ability to move the shoulder. °· Swelling and tenderness on top of the shoulder. °· Redness or warmth in the shoulder. °· Bruising. °· A change in the shape of the shoulder. °DIAGNOSIS  °Your healthcare provider may: °· Ask about your symptoms. °· Ask about recent activity that might have caused those symptoms. °· Examine your shoulder. You may be asked to do simple exercises to test movement. The other shoulder will be examined for comparison. °· Order some tests that provide a look inside the body. They can show the extent of the injury. The tests could include: °¨ X-rays. °¨ CT (computed tomography) scan. °¨ MRI (magnetic resonance imaging) scan. °RISKS AND COMPLICATIONS °· Loss of full shoulder motion. °· Ongoing shoulder pain. °TREATMENT  °How long it takes to recover from a shoulder sprain depends on how  severe it was. Treatment options may include: °· Rest. You should not use the arm or shoulder until it heals. °· Ice. For 2 or 3 days after the injury, put an ice pack on the shoulder up to 4 times a day. It should stay on for 15 to 20 minutes each time. Wrap the ice in a towel so it does not touch your skin. °· Over-the-counter medicine to relieve pain. °· A sling or brace. This will keep the arm still while the shoulder is healing. °· Physical therapy or rehabilitation exercises. These will help you regain strength and motion. Ask your healthcare provider when it is OK to begin these exercises. °· Surgery. The need for surgery is rare with a sprained shoulder, but some people may need surgery to keep the joint in place and reduce pain. °HOME CARE INSTRUCTIONS  °· Ask your healthcare provider about what you should and should not do while your shoulder heals. °· Make sure you know how to apply ice to the correct area of your shoulder. °· Talk with your healthcare provider about which medications should be used for pain and swelling. °· If rehabilitation therapy will be needed, ask your healthcare provider to refer you to a therapist. If it is not recommended, then ask about at-home exercises. Find out when exercise should begin. °SEEK MEDICAL CARE IF:  °Your pain, swelling, or redness at the joint increases. °SEEK IMMEDIATE MEDICAL CARE IF:  °· You have a fever. °· You cannot move your arm or shoulder. °Document Released: 09/03/2008 Document   Revised: 07/10/2011 Document Reviewed: 09/03/2008 °ExitCare® Patient Information ©2015 [employer]. This information is not intended to replace advice given to you by your health care provider. Make sure you discuss any questions you have with your health care provider. ° °

## 2014-12-05 NOTE — ED Notes (Signed)
Pt made aware to return if symptoms worsen or if any life threatening symptoms occur.   

## 2014-12-05 NOTE — ED Notes (Signed)
Pt c/o pain in right arm that goes from shoulder to wrist that started about 3 weeks ago but has gotten worse in the last 3 days. Pt has reported trouble with with during ROM in the right arm. No awareness of injury to right arm. Pt reports back trouble and doesn't know if this is related. Radial purse strong, capillary refill less than 3 seconds on right side.

## 2014-12-06 NOTE — ED Provider Notes (Signed)
CSN: [id number]     Arrival date & time 12/05/14  4098 History   First MD Initiated Contact with Patient 12/05/14 1048     Chief Complaint  Patient presents with  . Arm Pain     (Consider location/radiation/quality/duration/timing/severity/associated sxs/prior Treatment) HPI   Noah Vasquez is a 34 y.o. male who presents to the Emergency Department complaining of right elbow and shoulder pain for 3 weeks.  States pain has worsened for 3 days.  He reports sharp radiating pain from his elbow to his shoulder.  Pain worsened with abduction of the arm and flexion and extension of the elbow.  He is unsure of known injury.  Also describes a "tingling" sensation intermittently to his arm.  He reports a hx of chronic back pain and takes narcotic medication daily, but denies relief of the arm pain.  He denies swelling, redness, pain to his fingers, headaches, and neck pain.   Past Medical History  Diagnosis Date  . Depression   . Anxiety   . Chronic back pain   . Headache(784.0)   . Bronchitis    Past Surgical History  Procedure Laterality Date  . Back surgery    . Hernia repair     History reviewed. No pertinent family history. History  Substance Use Topics  . Smoking status: Current Every Day Smoker -- 1.00 packs/day for 16 years    Types: Cigarettes  . Smokeless tobacco: Not on file  . Alcohol Use: No    Review of Systems  Constitutional: Negative for fever and chills.  Respiratory: Negative for shortness of breath.   Cardiovascular: Negative for chest pain.  Genitourinary: Negative for dysuria and difficulty urinating.  Musculoskeletal: Positive for arthralgias (right elbow and shoulder pain). Negative for joint swelling.  Skin: Negative for color change and wound.  Neurological: Negative for dizziness, weakness and numbness.  All other systems reviewed and are negative.     Allergies  Wellbutrin  Home Medications   Prior to Admission medications   Medication Sig  Start Date End Date Taking? Authorizing Provider  atorvastatin (LIPITOR) 20 MG tablet Take 1 tablet (20 mg total) by mouth at bedtime. 09/16/14  Yes Merlyn Albert, MD  Naproxen-Esomeprazole (VIMOVO) 500-20 MG TBEC Take 1 tablet by mouth 2 (two) times daily.    Yes Historical Provider, MD  tiZANidine (ZANAFLEX) 4 MG tablet Take 4 mg by mouth every 6 (six) hours as needed for muscle spasms.   Yes Historical Provider, MD  albuterol (PROVENTIL HFA;VENTOLIN HFA) 108 (90 BASE) MCG/ACT inhaler Inhale 2 puffs into the lungs every 6 (six) hours as needed for wheezing. 01/24/13   Merlyn Albert, MD  diazepam (VALIUM) 5 MG tablet Take 5 mg by mouth as directed. 11/17/14   Historical Provider, MD  oxyCODONE-acetaminophen (PERCOCET) 7.5-325 MG per tablet Take 1 tablet by mouth every 6 (six) hours as needed for severe pain. 12/05/14   Millie Shorb, PA-C  predniSONE (DELTASONE) 10 MG tablet Take 6 tablets day one, 5 tablets day two, 4 tablets day three, 3 tablets day four, 2 tablets day five, then 1 tablet day six 12/05/14   Erlin Gardella, PA-C   BP 121/75 mmHg  Pulse 62  Temp(Src) 98.3 F (36.8 C) (Oral)  Resp 16  Ht 5\' 9"  (1.753 m)  Wt 226 lb (102.513 kg)  BMI 33.36 kg/m2  SpO2 99% Physical Exam  Constitutional: He is oriented to person, place, and time. He appears well-developed and well-nourished. No distress.  HENT:  Head: Normocephalic and atraumatic.  Neck: Normal range of motion. Neck supple.  Cardiovascular: Normal rate, regular rhythm, normal heart sounds and intact distal pulses.   Pulmonary/Chest: Effort normal and breath sounds normal. No respiratory distress.  Musculoskeletal: He exhibits tenderness. He exhibits no edema.  Tenderness with ROM of the right elbow and shoulder.  Radial pulse is brisk, distal sensation intact.  CR< 2 sec.  No erythema, edema or excessive warmth. no bony deformity.  Patient has full ROM. Compartments soft.  Neurological: He is alert and oriented to person,  place, and time. He exhibits normal muscle tone. Coordination normal.  Skin: Skin is warm and dry.  Nursing note and vitals reviewed.   ED Course  Procedures (including critical care time) Labs Review Labs Reviewed - No data to display  Imaging Review Dg Cervical Spine Complete  12/05/2014   CLINICAL DATA:  34 year old male with a history of neck pain  EXAM: CERVICAL SPINE  4+ VIEWS  COMPARISON:  None.  FINDINGS: Cervical Spine:  Cervical elements from the level of the C1-T1 maintain alignment, without subluxation, anterolisthesis, retrolisthesis.  No acute fracture line identified.  Unremarkable appearance of the craniocervical junction.  Vertebral body heights maintained as well as disc space heights.  No significant degenerative disc disease or endplate changes.  No significant facet disease.  Prevertebral soft tissues within normal limits.  Edentulous  IMPRESSION: No radiographic evidence of acute fracture or malalignment of the cervical spine.  Signed,  Yvone Neu. Loreta Ave, DO  [facility]  [facility]   Electronically Signed   By: Gilmer Mor D.O.   On: 12/05/2014 12:26   Dg Shoulder Right  12/05/2014   CLINICAL DATA:  34 year old male with a history of neck pain and shoulder pain.  EXAM: RIGHT SHOULDER - 2+ VIEW  COMPARISON:  None.  FINDINGS: No acute fracture.  Glenohumeral joint appears congruent.  No significant degenerative changes. Unremarkable appearance of the visualized right hemi thorax.  No significant soft tissue swelling. No radiopaque foreign body. Distal aspect of electrodes/spinal stimulator projects over the mid thoracic spine.  IMPRESSION: Negative for acute bony abnormality.  Signed,  Yvone Neu. Loreta Ave, DO  [facility]  [facility]   Electronically Signed   By: Gilmer Mor D.O.   On: 12/05/2014 12:39   Dg Elbow Complete Right  12/05/2014   CLINICAL DATA:  34 year old male with right  elbow pain for the past 3 weeks, worsening over the past 3 days.  EXAM: RIGHT ELBOW - COMPLETE 3+ VIEW  COMPARISON:  No priors.  FINDINGS: There is no evidence of fracture, dislocation, or joint effusion. There is no evidence of arthropathy or other focal bone abnormality. Soft tissues are unremarkable.  IMPRESSION: Negative.   Electronically Signed   By: Trudie Reed M.D.   On: 12/05/2014 12:37     EKG Interpretation None      MDM   Final diagnoses:  Elbow pain, right  Shoulder sprain, right, initial encounter   Pt is well appearing.  No focal neuro deficits.  XR's neg for acute injury.  Possible inflammatory process. No concerning sx's for infectious process.  Will tx symptomatically and given ortho referral.  Appears stable for d/c.      Pauline Aus, PA-C 12/06/14 2133  Vanetta Mulders, MD 12/10/14 [phone]

## 2014-12-16 ENCOUNTER — Ambulatory Visit: Payer: 59 | Admitting: Family Medicine

## 2014-12-18 ENCOUNTER — Ambulatory Visit (INDEPENDENT_AMBULATORY_CARE_PROVIDER_SITE_OTHER): Payer: 59 | Admitting: Family Medicine

## 2014-12-18 ENCOUNTER — Encounter: Payer: Self-pay | Admitting: Family Medicine

## 2014-12-18 VITALS — BP 118/86 | Ht 70.5 in | Wt 222.2 lb

## 2014-12-18 DIAGNOSIS — J4520 Mild intermittent asthma, uncomplicated: Secondary | ICD-10-CM | POA: Diagnosis not present

## 2014-12-18 DIAGNOSIS — J329 Chronic sinusitis, unspecified: Secondary | ICD-10-CM

## 2014-12-18 DIAGNOSIS — J683 Other acute and subacute respiratory conditions due to chemicals, gases, fumes and vapors: Secondary | ICD-10-CM

## 2014-12-18 MED ORDER — ALBUTEROL SULFATE HFA 108 (90 BASE) MCG/ACT IN AERS: 2.0000 | INHALATION_SPRAY | Freq: Four times a day (QID) | RESPIRATORY_TRACT | Status: DC | PRN

## 2014-12-18 MED ORDER — PREDNISONE 20 MG PO TABS: ORAL_TABLET | ORAL | Status: DC

## 2014-12-18 MED ORDER — CLARITHROMYCIN 500 MG PO TABS
500.0000 mg | ORAL_TABLET | Freq: Two times a day (BID) | ORAL | Status: DC
Start: 2014-12-18 — End: 2015-01-05

## 2014-12-18 NOTE — Progress Notes (Signed)
   Subjective:    Patient ID: Noah Vasquez, male    DOB: 01-16-1981, 34 y.o.   MRN: [id number]  Cough This is a new problem. The current episode started in the past 7 days. The problem has been gradually improving. The problem occurs every few minutes. The cough is productive of sputum. Associated symptoms include postnasal drip, rhinorrhea, a sore throat and wheezing. Associated symptoms comments: Diarrhea. He has tried steroid inhaler for the symptoms. The treatment provided no relief.   Patient states no other concerns this visit.  Head cold throat sore  Dim energy and cough and cong  Bad coughing spells off and on   Pos phlegm prodctn     Review of Systems  HENT: Positive for postnasal drip, rhinorrhea and sore throat.   Respiratory: Positive for cough and wheezing.    no vomiting no diarrhea     Objective:   Physical Exam  Alert no apparent distress. HEENT moderate nasal congestion pharynx normal lungs bilateral rhonchi significant wheezes no tachypnea      Assessment & Plan:  Impression rhinosinusitis/bronchitis with reactive airways exacerbation plan prednisone taper. Antibiotics prescribed. Symptomatic care discussed WSL

## 2014-12-25 ENCOUNTER — Telehealth: Payer: Self-pay | Admitting: Family Medicine

## 2014-12-25 MED ORDER — AMOXICILLIN-POT CLAVULANATE 875-125 MG PO TABS: ORAL_TABLET | ORAL | Status: DC

## 2014-12-25 NOTE — Telephone Encounter (Signed)
Discontinue Biaxin, I recommend Augmentin 875 one twice a day 10 days with food follow-up if ongoing troubles

## 2014-12-25 NOTE — Telephone Encounter (Signed)
Spoke with patient and patient has c/o wheezing, cough, and congestion. Patient was recently seen on 8/19 for cough and states that he is on the last day of antibiotics and the patient is still not feeling any better. Patient would like to know if there is anything else he can take.

## 2014-12-25 NOTE — Telephone Encounter (Signed)
Spoke with patient and informed him per Dr.Scott that Augmentin 875 one tablet twice a day for 10 days with food was called into pharmacy and if symptoms persist to follow up.Patient verbalized understanding.

## 2014-12-25 NOTE — Telephone Encounter (Signed)
Pt will be finishing his antibiotics tomorrow and is still not better. Pt wants to know if something else can be called in.   [employer]

## 2015-01-02 LAB — LIPID PANEL
CHOLESTEROL TOTAL: 214 mg/dL — AB (ref 100–199)
Chol/HDL Ratio: 6.3 ratio units — ABNORMAL HIGH (ref 0.0–5.0)
HDL: 34 mg/dL — AB (ref >39)
LDL CALC: 138 mg/dL — AB (ref 0–99)
Triglycerides: 209 mg/dL — ABNORMAL HIGH (ref 0–149)
VLDL CHOLESTEROL CAL: 42 mg/dL — AB (ref 5–40)

## 2015-01-02 LAB — HEPATIC FUNCTION PANEL
ALT: 49 IU/L — ABNORMAL HIGH (ref 0–44)
AST: 41 IU/L — AB (ref 0–40)
Albumin: 4.4 g/dL (ref 3.5–5.5)
Alkaline Phosphatase: 87 IU/L (ref 39–117)
Bilirubin Total: 0.5 mg/dL (ref 0.0–1.2)
Bilirubin, Direct: 0.13 mg/dL (ref 0.00–0.40)
Total Protein: 6.7 g/dL (ref 6.0–8.5)

## 2015-01-05 ENCOUNTER — Ambulatory Visit (INDEPENDENT_AMBULATORY_CARE_PROVIDER_SITE_OTHER): Payer: 59 | Admitting: Family Medicine

## 2015-01-05 ENCOUNTER — Encounter: Payer: Self-pay | Admitting: Family Medicine

## 2015-01-05 VITALS — BP 120/84 | Ht 70.5 in | Wt 225.0 lb

## 2015-01-05 DIAGNOSIS — J329 Chronic sinusitis, unspecified: Secondary | ICD-10-CM | POA: Diagnosis not present

## 2015-01-05 DIAGNOSIS — E785 Hyperlipidemia, unspecified: Secondary | ICD-10-CM | POA: Diagnosis not present

## 2015-01-05 DIAGNOSIS — J310 Chronic rhinitis: Secondary | ICD-10-CM

## 2015-01-05 DIAGNOSIS — Z79899 Other long term (current) drug therapy: Secondary | ICD-10-CM | POA: Diagnosis not present

## 2015-01-05 DIAGNOSIS — J683 Other acute and subacute respiratory conditions due to chemicals, gases, fumes and vapors: Secondary | ICD-10-CM

## 2015-01-05 DIAGNOSIS — J4520 Mild intermittent asthma, uncomplicated: Secondary | ICD-10-CM | POA: Diagnosis not present

## 2015-01-05 DIAGNOSIS — G8929 Other chronic pain: Secondary | ICD-10-CM

## 2015-01-05 DIAGNOSIS — M545 Low back pain: Secondary | ICD-10-CM | POA: Diagnosis not present

## 2015-01-05 MED ORDER — ATORVASTATIN CALCIUM 20 MG PO TABS: 20.0000 mg | ORAL_TABLET | Freq: Every day | ORAL | Status: DC

## 2015-01-05 NOTE — Progress Notes (Signed)
   Subjective:    Patient ID: Noah Vasquez, male    DOB: May 31, 1980, 34 y.o.   MRN: [id number]  Hyperlipidemia This is a chronic problem. The problem is controlled. There are no known factors aggravating his hyperlipidemia. Current antihyperlipidemic treatment includes statins. The current treatment provides significant improvement of lipids. There are no compliance problems.  There are no known risk factors for coronary artery disease.  Patient has had blood work completed.   Results for orders placed or performed in visit on 09/16/14  Lipid panel  Result Value Ref Range   Cholesterol, Total 214 (H) 100 - 199 mg/dL   Triglycerides 409 (H) 0 - 149 mg/dL   HDL 34 (L) >81 mg/dL   VLDL Cholesterol Cal 42 (H) 5 - 40 mg/dL   LDL Calculated 191 (H) 0 - 99 mg/dL   Chol/HDL Ratio 6.3 (H) 0.0 - 5.0 ratio units  Hepatic function panel  Result Value Ref Range   Total Protein 6.7 6.0 - 8.5 g/dL   Albumin 4.4 3.5 - 5.5 g/dL   Bilirubin Total 0.5 0.0 - 1.2 mg/dL   Bilirubin, Direct 4.78 0.00 - 0.40 mg/dL   Alkaline Phosphatase 87 39 - 117 IU/L   AST 41 (H) 0 - 40 IU/L   ALT 49 (H) 0 - 44 IU/L   Testing patient having ongoing back pain. Went into great length discussing this today. Unfortunately considering potential disability because he cannot do his current job.  Residual sinus symptoms. See phone message. Call back antibiotics are [place]. Still some secretions overall better. No fever no chills. No major headache.   Patient has no concerns at this time.     Review of Systems Occasional nocturnal wheezing no chest pain no shortness of breath no fever no chills    Objective:   Physical Exam  Alert vitals stable some residual HEENT congestion pharynx normal lungs clear today heart regular in rhythm. Low back positive tender to palpation      Assessment & Plan:  Impression 1 hyperlipidemia control improved #2 very slight elevation of liver enzymes felt to be reasonable with lipid  medication. Discussed at length. #3 chronic smoker encouraged to stop #4 residual sinusitis #5 disability concerns discussed at length. Patient needs to try to move towards job which requires no back lifting [place] as noted above. Medications refilled. Diet exercise discussed. Recheck in 4 months. Blood work one week before then. WSL

## 2015-01-11 DIAGNOSIS — Z029 Encounter for administrative examinations, unspecified: Secondary | ICD-10-CM

## 2015-02-09 ENCOUNTER — Encounter: Payer: Self-pay | Admitting: Family Medicine

## 2015-02-09 ENCOUNTER — Ambulatory Visit (INDEPENDENT_AMBULATORY_CARE_PROVIDER_SITE_OTHER): Payer: 59 | Admitting: Family Medicine

## 2015-02-09 VITALS — BP 110/76 | Ht 70.5 in | Wt 227.4 lb

## 2015-02-09 DIAGNOSIS — M545 Low back pain: Secondary | ICD-10-CM | POA: Diagnosis not present

## 2015-02-09 NOTE — Progress Notes (Signed)
   Subjective:    Patient ID: Noah Vasquez, male    DOB: [birth year], 34 y.o.   MRN: [id number]  HPI  Patient in today to get a referral to for back surgery to have hardware removed from lower back.   Back ref needed to see surg  Saw PA at dr bethea's oct 5th  stim implant is helping but the hardware in the low back is not doing well   Causing pain, orig put in by dr Jule Ser, who did first bk surg, fusion did not fuse after thirteen mo's.  Saw dr critzer had the sec back fusion and it took,   Pt wishes to see new neurosurg, and dr critzer is in the same office  Pt would like to see dr Channing Mutters if possible,  Patient experiencing progressive low back pain. Now has pretty much been out of work for years. At all his hoped that nerve stimulator would help get back to functional capacity.  Continues to experience substantial low back pain.  Dr. Ozzie Hoyle PA and Dr. Fenton Foy both feel the patient may be experiencing some low back hardware dysfunction from his old fusion.  Patient would like to see Dr. blood. Dr. Tia Masker is insisting on scan of low back before seen the patient.       Patient states no other concerns this visit.   Review of Systems No headache no chest pain no abdominal pain chronic low back pain no change in bowel habits complete ROS otherwise negative    Objective:   Physical Exam Alert vitals stable HET normal lungs clear heart rare rhythm low back tender to palpation abdomen negative plus minus straight leg raise on left       Assessment & Plan:  Impression 1 chronic low back pain. With concern regarding fusion status and hardware status. Very long discussion held. Patient unfortunately still in long-term disability with this. Plan 25 minutes spent most in discussion. CT scan of lumbar spine to assess hardware. Referral Dr. Tia Masker. WSL

## 2015-02-12 ENCOUNTER — Ambulatory Visit (HOSPITAL_COMMUNITY)
Admission: RE | Admit: 2015-02-12 | Discharge: 2015-02-12 | Disposition: A | Discharge status: Home / Self Care | Payer: 59 | Source: Ambulatory Visit | Attending: Family Medicine | Admitting: Family Medicine

## 2015-02-12 DIAGNOSIS — M545 Low back pain: Secondary | ICD-10-CM | POA: Insufficient documentation

## 2015-02-12 DIAGNOSIS — Z981 Arthrodesis status: Secondary | ICD-10-CM | POA: Diagnosis not present

## 2015-02-12 DIAGNOSIS — M4686 Other specified inflammatory spondylopathies, lumbar region: Secondary | ICD-10-CM | POA: Diagnosis not present

## 2015-02-25 ENCOUNTER — Encounter: Payer: Self-pay | Admitting: Family Medicine

## 2015-04-15 DIAGNOSIS — Z029 Encounter for administrative examinations, unspecified: Secondary | ICD-10-CM

## 2015-05-07 ENCOUNTER — Ambulatory Visit: Payer: 59 | Admitting: Family Medicine

## 2015-05-24 ENCOUNTER — Ambulatory Visit (INDEPENDENT_AMBULATORY_CARE_PROVIDER_SITE_OTHER): Payer: 59 | Admitting: Family Medicine

## 2015-05-24 ENCOUNTER — Encounter: Payer: Self-pay | Admitting: Family Medicine

## 2015-05-24 VITALS — BP 114/80 | Temp 98.3°F | Ht 70.5 in | Wt 233.0 lb

## 2015-05-24 DIAGNOSIS — R300 Dysuria: Secondary | ICD-10-CM | POA: Diagnosis not present

## 2015-05-24 DIAGNOSIS — R319 Hematuria, unspecified: Secondary | ICD-10-CM | POA: Diagnosis not present

## 2015-05-24 LAB — POCT URINALYSIS DIPSTICK
RBC UA: 250
pH, UA: 8

## 2015-05-24 MED ORDER — CIPROFLOXACIN HCL 500 MG PO TABS: 500.0000 mg | ORAL_TABLET | Freq: Two times a day (BID) | ORAL | Status: DC

## 2015-05-24 NOTE — Progress Notes (Signed)
   Subjective:    Patient ID: Noah Vasquez, male    DOB: 1981-02-27, 35 y.o.   MRN: [id number]  Urinary Tract Infection  This is a new problem. The current episode started in the past 7 days. The problem occurs every urination. The quality of the pain is described as burning. The pain is moderate. There has been no fever. Associated symptoms include hematuria. He has tried nothing for the symptoms. The treatment provided no relief.    patient notes increased urinary frequency over the past week some nonspecific low abdominal discomfort. No major fever or chills. Positive dysuria. Positive gross hematuria seen.   No vomiting no headache no chest pain   Review of Systems  Genitourinary: Positive for hematuria.    no rash no cough    Objective:   Physical Exam   alert vitals stable HEENT normal lungs clear heart rare rhythm no CVA tenderness abdomen benign prostate boggy and tender   urinalysis 6 28 red blood cells high-powered field    Assessment & Plan:   impression 1 gross hematuria #2 acute prostatitis #3 potential association between 1 into discussed # planrecheck in several weeks. Will repeaturinalysis and 21 day course of Cipro twice a day multiple questions answered. Film no full workup warranted yet. However hematuria were to persist or recur we would need to do it WSL 25 minutes spent most in discussion

## 2015-05-28 ENCOUNTER — Emergency Department (HOSPITAL_COMMUNITY)
Admission: EM | Admit: 2015-05-28 | Discharge: 2015-05-28 | Disposition: A | Discharge status: Home / Self Care | Payer: 59 | Attending: Emergency Medicine | Admitting: Emergency Medicine

## 2015-05-28 ENCOUNTER — Encounter (HOSPITAL_COMMUNITY): Payer: Self-pay

## 2015-05-28 ENCOUNTER — Emergency Department (HOSPITAL_COMMUNITY): Payer: 59

## 2015-05-28 DIAGNOSIS — Z8709 Personal history of other diseases of the respiratory system: Secondary | ICD-10-CM | POA: Insufficient documentation

## 2015-05-28 DIAGNOSIS — Z792 Long term (current) use of antibiotics: Secondary | ICD-10-CM | POA: Diagnosis not present

## 2015-05-28 DIAGNOSIS — G8929 Other chronic pain: Secondary | ICD-10-CM | POA: Insufficient documentation

## 2015-05-28 DIAGNOSIS — F17210 Nicotine dependence, cigarettes, uncomplicated: Secondary | ICD-10-CM | POA: Insufficient documentation

## 2015-05-28 DIAGNOSIS — N200 Calculus of kidney: Secondary | ICD-10-CM | POA: Insufficient documentation

## 2015-05-28 DIAGNOSIS — F329 Major depressive disorder, single episode, unspecified: Secondary | ICD-10-CM | POA: Diagnosis not present

## 2015-05-28 DIAGNOSIS — F419 Anxiety disorder, unspecified: Secondary | ICD-10-CM | POA: Diagnosis not present

## 2015-05-28 DIAGNOSIS — Z791 Long term (current) use of non-steroidal anti-inflammatories (NSAID): Secondary | ICD-10-CM | POA: Insufficient documentation

## 2015-05-28 DIAGNOSIS — R109 Unspecified abdominal pain: Secondary | ICD-10-CM | POA: Diagnosis present

## 2015-05-28 DIAGNOSIS — Z79899 Other long term (current) drug therapy: Secondary | ICD-10-CM | POA: Insufficient documentation

## 2015-05-28 LAB — URINALYSIS, ROUTINE W REFLEX MICROSCOPIC
Bilirubin Urine: NEGATIVE
GLUCOSE, UA: NEGATIVE mg/dL
Ketones, ur: NEGATIVE mg/dL
Leukocytes, UA: NEGATIVE
NITRITE: NEGATIVE
PH: 6 (ref 5.0–8.0)
Protein, ur: NEGATIVE mg/dL
SPECIFIC GRAVITY, URINE: 1.02 (ref 1.005–1.030)

## 2015-05-28 LAB — CBC
HEMATOCRIT: 47.1 % (ref 39.0–52.0)
HEMOGLOBIN: 16.3 g/dL (ref 13.0–17.0)
MCH: 29.7 pg (ref 26.0–34.0)
MCHC: 34.6 g/dL (ref 30.0–36.0)
MCV: 85.9 fL (ref 78.0–100.0)
Platelets: 194 10*3/uL (ref 150–400)
RBC: 5.48 MIL/uL (ref 4.22–5.81)
RDW: 12.4 % (ref 11.5–15.5)
WBC: 10.5 10*3/uL (ref 4.0–10.5)

## 2015-05-28 LAB — COMPREHENSIVE METABOLIC PANEL
ALT: 33 U/L (ref 17–63)
ANION GAP: 9 (ref 5–15)
AST: 31 U/L (ref 15–41)
Albumin: 4.3 g/dL (ref 3.5–5.0)
Alkaline Phosphatase: 75 U/L (ref 38–126)
BILIRUBIN TOTAL: 0.6 mg/dL (ref 0.3–1.2)
BUN: 20 mg/dL (ref 6–20)
CHLORIDE: 103 mmol/L (ref 101–111)
CO2: 25 mmol/L (ref 22–32)
Calcium: 9.5 mg/dL (ref 8.9–10.3)
Creatinine, Ser: 1.65 mg/dL — ABNORMAL HIGH (ref 0.61–1.24)
GFR calc Af Amer: >60 mL/min (ref >60)
GFR calc non Af Amer: 53 mL/min — ABNORMAL LOW (ref >60)
GLUCOSE: 164 mg/dL — AB (ref 65–99)
Potassium: 4.1 mmol/L (ref 3.5–5.1)
Sodium: 137 mmol/L (ref 135–145)
TOTAL PROTEIN: 6.7 g/dL (ref 6.5–8.1)

## 2015-05-28 LAB — URINE MICROSCOPIC-ADD ON
Squamous Epithelial / LPF: NONE SEEN
WBC UA: NONE SEEN WBC/hpf (ref 0–5)

## 2015-05-28 LAB — LIPASE, BLOOD: Lipase: 31 U/L (ref 11–51)

## 2015-05-28 MED ORDER — OXYCODONE-ACETAMINOPHEN 5-325 MG PO TABS: 1.0000 | ORAL_TABLET | Freq: Four times a day (QID) | ORAL | Status: DC | PRN

## 2015-05-28 MED ORDER — SODIUM CHLORIDE 0.9 % IV BOLUS (SEPSIS)
1000.0000 mL | Freq: Once | INTRAVENOUS | Status: AC
  Administered 2015-05-28: 1000 mL via INTRAVENOUS

## 2015-05-28 MED ORDER — ONDANSETRON HCL 4 MG/2ML IJ SOLN
4.0000 mg | Freq: Once | INTRAMUSCULAR | Status: AC
  Administered 2015-05-28: 4 mg via INTRAVENOUS
  Filled 2015-05-28: qty 2

## 2015-05-28 MED ORDER — TAMSULOSIN HCL 0.4 MG PO CAPS: 0.4000 mg | ORAL_CAPSULE | Freq: Every day | ORAL | Status: DC

## 2015-05-28 MED ORDER — MORPHINE SULFATE (PF) 4 MG/ML IV SOLN
4.0000 mg | Freq: Once | INTRAVENOUS | Status: AC
  Administered 2015-05-28: 4 mg via INTRAVENOUS
  Filled 2015-05-28: qty 1

## 2015-05-28 MED ORDER — ONDANSETRON 4 MG PO TBDP: 4.0000 mg | ORAL_TABLET | Freq: Three times a day (TID) | ORAL | Status: DC | PRN

## 2015-05-28 MED ORDER — KETOROLAC TROMETHAMINE 30 MG/ML IJ SOLN
30.0000 mg | Freq: Once | INTRAMUSCULAR | Status: AC
  Administered 2015-05-28: 30 mg via INTRAVENOUS
  Filled 2015-05-28: qty 1

## 2015-05-28 NOTE — Discharge Instructions (Signed)
Please increase your fluid intake. Your kidney function was slightly elevated today at 1.65 which is higher than it was in May 2016. Please follow-up with your primary care doctor to have this followed. I recommended to avoid NSAIDs at this time which includes ibuprofen (Advil, Motrin), naproxen (Aleve), aspirin (Goody powders). You're also found to have a kidney stone on the left side. It is 3 mm and has a good chance of passing on exam. Please follow-up with urology. If you develop uncontrolled pain, vomiting that will not stop, fever, or unable to urinate, please return to the hospital.   I feel you may take Percocet for pain control. Please do not take this with your Vicodin. We are discharging you with a short prescription for Percocet as a stronger than the Vicodin that you take to help control your pain from your kidney stone.  You may shows this paperwork to your doctor who writes your pain medication as verification.  Dietary Guidelines to Help Prevent Kidney Stones Your risk of kidney stones can be decreased by adjusting the foods you eat. The most important thing you can do is drink enough fluid. You should drink enough fluid to keep your urine clear or pale yellow. The following guidelines provide specific information for the type of kidney stone you have had. GUIDELINES ACCORDING TO TYPE OF KIDNEY STONE Calcium Oxalate Kidney Stones  Reduce the amount of salt you eat. Foods that have a lot of salt cause your body to release excess calcium into your urine. The excess calcium can combine with a substance called oxalate to form kidney stones.  Reduce the amount of animal protein you eat if the amount you eat is excessive. Animal protein causes your body to release excess calcium into your urine. Ask your dietitian how much protein from animal sources you should be eating.  Avoid foods that are high in oxalates. If you take vitamins, they should have less than 500 mg of vitamin C. Your body  turns vitamin C into oxalates. You do not need to avoid fruits and vegetables high in vitamin C. Calcium Phosphate Kidney Stones  Reduce the amount of salt you eat to help prevent the release of excess calcium into your urine.  Reduce the amount of animal protein you eat if the amount you eat is excessive. Animal protein causes your body to release excess calcium into your urine. Ask your dietitian how much protein from animal sources you should be eating.  Get enough calcium from food or take a calcium supplement (ask your dietitian for recommendations). Food sources of calcium that do not increase your risk of kidney stones include:  Broccoli.  Dairy products, such as cheese and yogurt.  Pudding. Uric Acid Kidney Stones  Do not have more than 6 oz of animal protein per day. FOOD SOURCES Animal Protein Sources  Meat (all types).  Poultry.  Eggs.  Fish, seafood. Foods High in [employer] seasonings.  Soy sauce.  Teriyaki sauce.  Cured and processed meats.  Salted crackers and snack foods.  Fast food.  Canned soups and most canned foods. Foods High in Oxalates  Grains:  Amaranth.  Barley.  Grits.  Wheat germ.  Bran.  Buckwheat flour.  All bran cereals.  Pretzels.  Whole wheat bread.  Vegetables:  Beans (wax).  Beets and beet greens.  Collard greens.  Eggplant.  Escarole.  Leeks.  Okra.  Parsley.  Rutabagas.  Spinach.  Swiss chard.  Tomato paste.  Fried potatoes.  Sweet  potatoes.  Fruits:  Red currants.  Figs.  Kiwi.  Rhubarb.  Meat and Other Protein Sources:  Beans (dried).  Soy burgers and other soybean products.  Miso.  Nuts (peanuts, almonds, pecans, cashews, hazelnuts).  Nut butters.  Sesame seeds and tahini (paste made of sesame seeds).  Poppy seeds.  Beverages:  Chocolate drink mixes.  Soy milk.  Instant iced tea.  Juices made from high-oxalate fruits or  vegetables.  Other:  Carob.  Chocolate.  Fruitcake.  Marmalades.   This information is not intended to replace advice given to you by your health care provider. Make sure you discuss any questions you have with your health care provider.   Document Released: 08/12/2010 Document Revised: 04/22/2013 Document Reviewed: 03/14/2013 Elsevier Interactive Patient Education 2016 Else[employer] Inc[employer]  Kidney Stones Kidney stones (urolithiasis) are deposits that form inside your kidneys. The intense pain is caused by the stone moving through the urinary tract. When the stone moves, the ureter goes into spasm around the stone. The stone is usually passed in the urine.  CAUSES   A disorder that makes certain neck glands produce too much parathyroid hormone (primary hyperparathyroidism).  A buildup of uric acid crystals, similar to gout in your joints.  Narrowing (stricture) of the ureter.  A kidney obstruction present at birth (congenital obstruction).  Previous surgery on the kidney or ureters.  Numerous kidney infections. SYMPTOMS   Feeling sick to your stomach (nauseous).  Throwing up (vomiting).  Blood in the urine (hematuria).  Pain that usually spreads (radiates) to the groin.  Frequency or urgency of urination. DIAGNOSIS   Taking a history and physical exam.  Blood or urine tests.  CT scan.  Occasionally, an examination of the inside of the urinary bladder (cystoscopy) is performed. TREATMENT   Observation.  Increasing your fluid intake.  Extracorporeal shock wave lithotripsy--This is a noninvasive procedure that uses shock waves to break up kidney stones.  Surgery may be needed if you have severe pain or persistent obstruction. There are various surgical procedures. Most of the procedures are performed with the use of small instruments. Only small incisions are needed to accommodate these instruments, so recovery time is minimized. The size, location, and chemical  composition are all important variables that will determine the proper choice of action for you. Talk to your health care provider to better understand your situation so that you will minimize the risk of injury to yourself and your kidney.  HOME CARE INSTRUCTIONS   Drink enough water and fluids to keep your urine clear or pale yellow. This will help you to pass the stone or stone fragments.  Strain all urine through the provided strainer. Keep all particulate matter and stones for your health care provider to see. The stone causing the pain may be as small as a grain of salt. It is very important to use the strainer each and every time you pass your urine. The collection of your stone will allow your health care provider to analyze it and verify that a stone has actually passed. The stone analysis will often identify what you can do to reduce the incidence of recurrences.  Only take over-the-counter or prescription medicines for pain, discomfort, or fever as directed by your health care provider.  Keep all follow-up visits as told by your health care provider. This is important.  Get follow-up X-rays if required. The absence of pain does not always mean that the stone has passed. It may have only stopped moving. If the  urine remains completely obstructed, it can cause loss of kidney function or even complete destruction of the kidney. It is your responsibility to make sure X-rays and follow-ups are completed. Ultrasounds of the kidney can show blockages and the status of the kidney. Ultrasounds are not associated with any radiation and can be performed easily in a matter of minutes.  Make changes to your daily diet as told by your health care provider. You may be told to:  Limit the amount of salt that you eat.  Eat 5 or more servings of fruits and vegetables each day.  Limit the amount of meat, poultry, fish, and eggs that you eat.  Collect a 24-hour urine sample as told by your health care  provider.You may need to collect another urine sample every 6-12 months. SEEK MEDICAL CARE IF:  You experience pain that is progressive and unresponsive to any pain medicine you have been prescribed. SEEK IMMEDIATE MEDICAL CARE IF:   Pain cannot be controlled with the prescribed medicine.  You have a fever or shaking chills.  The severity or intensity of pain increases over 18 hours and is not relieved by pain medicine.  You develop a new onset of abdominal pain.  You feel faint or pass out.  You are unable to urinate.   This information is not intended to replace advice given to you by your health care provider. Make sure you discuss any questions you have with your health care provider.   Document Released: 04/17/2005 Document Revised: 01/06/2015 Document Reviewed: 09/18/2012 Elsevier Interactive Patient Education [employer]

## 2015-05-28 NOTE — ED Provider Notes (Signed)
TIME SEEN: 6:00 AM  CHIEF COMPLAINT: Dysuria for 5 days, flank pain that started last night  HPI: Pt is a 35 y.o. male with history of chronic back pain status post 3 back surgeries or I can chronically who presents the emergency department with left flank pain that started last night and kept him from sleeping. He is described dysuria last week. Saw his PCP Dr. Fenton Malling tract infection which she was told to take Cipro for 21 days. States flank pain started last night in his home Vicodin was not controlling his pain. Describes his pain as different than his chronic back pain. Denies any no back injury. No numbness, tingling or focal weakness. No bowel or bladder incontinence.  Denies history of previous kidney stone. He is sexually active with one male partner. He is married. No history of STDs. Testicular pain or swelling. No penile discharge.  Pt has had nausea but no vomiting or diarrhea.  ROS: See HPI Constitutional: no fever  Eyes: no drainage  ENT: no runny nose   Cardiovascular:  no chest pain  Resp: no SOB  GI: no vomiting GU:  dysuria Integumentary: no rash  Allergy: no hives  Musculoskeletal: no leg swelling  Neurological: no slurred speech ROS otherwise negative  PAST MEDICAL HISTORY/PAST SURGICAL HISTORY:  Past Medical History  Diagnosis Date  . Depression   . Anxiety   . Chronic back pain   . Headache(784.0)   . Bronchitis     MEDICATIONS:  Prior to Admission medications   Medication Sig Start Date End Date Taking? Authorizing Provider  albuterol (PROVENTIL HFA;VENTOLIN HFA) 108 (90 BASE) MCG/ACT inhaler Inhale 2 puffs into the lungs every 6 (six) hours as needed for wheezing. 12/18/14   Merlyn Albert, MD  atorvastatin (LIPITOR) 20 MG tablet Take 1 tablet (20 mg total) by mouth at bedtime. 01/05/15   Merlyn Albert, MD  ciprofloxacin (CIPRO) 500 MG tablet Take 1 tablet (500 mg total) by mouth 2 (two) times daily. 05/24/15   Merlyn Albert, MD  cyclobenzaprine  (FLEXERIL) 10 MG tablet Take 10 mg by mouth 3 (three) times daily as needed for muscle spasms.    Historical Provider, MD  diazepam (VALIUM) 5 MG tablet Take 5 mg by mouth as directed. Reported on 05/24/2015 11/17/14   Historical Provider, MD  HYDROcodone-acetaminophen (NORCO) 10-325 MG per tablet Take 1 tablet by mouth every 6 (six) hours as needed.    Historical Provider, MD  Magnesium Oxide (MAG-CAPS PO) Take 1,500 mg by mouth daily.    Historical Provider, MD  Naproxen-Esomeprazole (VIMOVO) 500-20 MG TBEC Take 1 tablet by mouth 2 (two) times daily.     Historical Provider, MD  tiZANidine (ZANAFLEX) 4 MG tablet Take 4 mg by mouth every 6 (six) hours as needed for muscle spasms. Reported on 05/24/2015    Historical Provider, MD    ALLERGIES:  Allergies  Allergen Reactions  . Wellbutrin [Bupropion] Other (See Comments)    Makes pt mean    SOCIAL HISTORY:  Social History  Substance Use Topics  . Smoking status: Current Every Day Smoker -- 1.00 packs/day for 16 years    Types: Cigarettes  . Smokeless tobacco: Not on file  . Alcohol Use: No    FAMILY HISTORY: No family history on file.  EXAM: BP 140/90 mmHg  Pulse 85  Temp(Src) 97.8 F (36.6 C) (Oral)  Resp 18  Ht  (1.753 m)  Wt 230 lb (104.327 kg)  BMI 33.95 kg/m2  SpO2 97% CONSTITUTIONAL: Alert and oriented and responds appropriately to questions. Appears uncomfortable but is afebrile and nontoxic HEAD: Normocephalic EYES: Conjunctivae clear, PERRL ENT: normal nose; no rhinorrhea; moist mucous membranes; pharynx without lesions noted NECK: Supple, no meningismus, no LAD  CARD: RRR; S1 and S2 appreciated; no murmurs, no clicks, no rubs, no gallops RESP: Normal chest excursion without splinting or tachypnea; breath sounds clear and equal bilaterally; no wheezes, no rhonchi, no rales, no hypoxia or respiratory distress, speaking full sentences ABD/GI: Normal bowel sounds; non-distended; soft, non-tender, no rebound, no  guarding, no peritoneal signs GU:  Normal external genitalia, circumcised male, normal penile shaft, no blood or discharge at the urethral meatus, no testicular masses or tenderness on exam, no scrotal masses or swelling, no hernias appreciated, 2+ femoral pulses bilaterally; no perineal erythema, warmth, subcutaneous air or crepitus; no high riding testicle, normal bilateral cremasteric reflex BACK:  The back appears normal and is non-tender to palpation, there is no CVA tenderness EXT: Normal ROM in all joints; non-tender to palpation; no edema; normal capillary refill; no cyanosis, no calf tenderness or swelling    SKIN: Normal color for age and race; warm NEURO: Moves all extremities equally, sensation to light touch intact diffusely, cranial nerves II through XII intact PSYCH: The patient's mood and manner are appropriate. Grooming and personal hygiene are appropriate.  MEDICAL DECISION MAKING: Patient here with what sounds like a kidney stone versus pyelonephritis. We'll proceed with labs, urine and CT imaging. We'll give IV fluids, Toradol, morphine, Zofran.  ED PROGRESS: Patient's labs show creatinine of 1.65. Was normal in May 2016. Otherwise labs are unremarkable. CT shows an obstructing 3 mm left distal ureteral calculus causing moderate hydronephrosis. No sign of urinary tract infection. Reports his pain is much better. Have discussed this finding of elevated creatinine with patient. Have advised him to avoid NSAIDs and increase his fluid intake. He is received 2 L of IV fluids in the emergency department. We'll discharge with Percocet for pain control, Zofran, Flomax and outpatient urology follow-up information. Discussed return precautions. He verbalized understanding and is comfortable with this plan.     Layla Maw Yuri Flener, DO 05/28/15 [phone]

## 2015-05-28 NOTE — ED Notes (Addendum)
Patient c/o left flank pain that radiates into lower abdomin denies pain in scrotum. Patient c/o burning with urination.

## 2015-05-28 NOTE — ED Notes (Signed)
Pt states he saw his pmd yesterday and diagnosed with uti and started on antibiotics.  Pt states he started hurting worse overnight and has not slept.

## 2015-05-31 LAB — GC/CHLAMYDIA PROBE AMP (~~LOC~~) NOT AT ARMC
CHLAMYDIA, DNA PROBE: NEGATIVE
NEISSERIA GONORRHEA: NEGATIVE

## 2015-06-14 ENCOUNTER — Ambulatory Visit: Payer: 59 | Admitting: Family Medicine

## 2015-07-05 ENCOUNTER — Ambulatory Visit: Payer: 59 | Admitting: Family Medicine

## 2015-07-07 DIAGNOSIS — Z029 Encounter for administrative examinations, unspecified: Secondary | ICD-10-CM

## 2015-09-14 ENCOUNTER — Ambulatory Visit (INDEPENDENT_AMBULATORY_CARE_PROVIDER_SITE_OTHER): Payer: 59 | Admitting: Family Medicine

## 2015-09-14 ENCOUNTER — Encounter: Payer: Self-pay | Admitting: Family Medicine

## 2015-09-14 VITALS — BP 130/80 | Ht 71.0 in | Wt 235.1 lb

## 2015-09-14 DIAGNOSIS — Z79899 Other long term (current) drug therapy: Secondary | ICD-10-CM

## 2015-09-14 DIAGNOSIS — K921 Melena: Secondary | ICD-10-CM | POA: Diagnosis not present

## 2015-09-14 DIAGNOSIS — Z0000 Encounter for general adult medical examination without abnormal findings: Secondary | ICD-10-CM | POA: Diagnosis not present

## 2015-09-14 DIAGNOSIS — E785 Hyperlipidemia, unspecified: Secondary | ICD-10-CM

## 2015-09-14 DIAGNOSIS — G8929 Other chronic pain: Secondary | ICD-10-CM

## 2015-09-14 DIAGNOSIS — M545 Low back pain: Secondary | ICD-10-CM

## 2015-09-14 MED ORDER — HYDROCODONE-ACETAMINOPHEN 10-325 MG PO TABS: 1.0000 | ORAL_TABLET | Freq: Two times a day (BID) | ORAL | Status: DC | PRN

## 2015-09-14 MED ORDER — ATORVASTATIN CALCIUM 20 MG PO TABS: 20.0000 mg | ORAL_TABLET | Freq: Every day | ORAL | Status: DC

## 2015-09-14 NOTE — Progress Notes (Signed)
Subjective:    Patient ID: Noah CanalesJamie A Vasquez, male    DOB: 1981/01/29, [age] y.o.   MRN: [id number] Patient presents supposedly just for a wellness visit with several complicated medical problems HPI The patient comes in today for a wellness visit.    A review of their health history was completed.  A review of medications was also completed.  Any needed refills: none  Eating habits: good  Falls/  MVA accidents in past few months: none  Regular exercise: walks daily   Specialist pt sees on regular basis: Dr. Edwena BundeBetheda  Preventative health issues were discussed.   Additional concerns: Patient needs referral to another pain doctor. Continues to have low back pain. Now back to work for several months. Still pursuing disability deep ache in the low back radiating in the pelvis and upper thighs. Nonsurgical at this point. Is had injections helped a little. Currently the medicine doctor no longer prescribing pain meds  Patient has blood in stool. Just happened to mention this during the course of conversation today. Bright red blood course of week no abdominal pain no rectal.  Compliant with lipid medication. Had lipid panel done at work but no liver panel. Also his sugar down.  Patient has blood work results from his employer.      Review of Systems  Constitutional: Negative for fever, activity change and appetite change.  HENT: Negative for congestion and rhinorrhea.   Eyes: Negative for discharge.  Respiratory: Negative for cough and wheezing.   Cardiovascular: Negative for chest pain.  Gastrointestinal: Negative for vomiting, abdominal pain and blood in stool.  Genitourinary: Negative for frequency and difficulty urinating.  Musculoskeletal: Negative for neck pain.  Skin: Negative for rash.  Allergic/Immunologic: Negative for environmental allergies and food allergies.  Neurological: Negative for weakness and headaches.  Psychiatric/Behavioral: Negative for agitation.  All  other systems reviewed and are negative.      Objective:   Physical Exam  Constitutional: He appears well-developed and well-nourished.  HENT:  Head: Normocephalic and atraumatic.  Right Ear: External ear normal.  Left Ear: External ear normal.  Nose: Nose normal.  Mouth/Throat: Oropharynx is clear and moist.  Eyes: EOM are normal. Pupils are equal, round, and reactive to light.  Neck: Normal range of motion. Neck supple. No thyromegaly present.  Cardiovascular: Normal rate, regular rhythm and normal heart sounds.   No murmur heard. Pulmonary/Chest: Effort normal and breath sounds normal. No respiratory distress. He has no wheezes.  Abdominal: Soft. Bowel sounds are normal. He exhibits no distension and no mass. There is no tenderness.  Genitourinary: Penis normal.  Musculoskeletal: Normal range of motion. He exhibits no edema.  Lymphadenopathy:    He has no cervical adenopathy.  Neurological: He is alert. He exhibits normal muscle tone.  Skin: Skin is warm and dry. No erythema.  Psychiatric: He has a normal mood and affect. His behavior is normal. Judgment normal.  Vitals reviewed.  Heme positive stool       Assessment & Plan:  Impression 1 wellness exam #2 chronic pain very challenging situation discussed at great length. Patient needs yet another pain specialist is currently not prescribing meds #3 hyperlipidemia control not certain need to check blood work discussed old blood work in workplace blood work discussed #4 hematochezia by history also of note stool is heme positive plan chronic medicines prescribed. GI referral rationale discussed. New pain specialist referral rationale discussed. Blood work further recommendations based on results wellness exam +25 minutes spent primarily discussion  on patient's chronic problems

## 2015-09-15 ENCOUNTER — Encounter: Payer: Self-pay | Admitting: Internal Medicine

## 2015-09-17 ENCOUNTER — Encounter: Payer: Self-pay | Admitting: Family Medicine

## 2015-10-05 ENCOUNTER — Ambulatory Visit: Payer: 59 | Admitting: Gastroenterology

## 2015-10-22 ENCOUNTER — Ambulatory Visit: Payer: 59 | Admitting: Gastroenterology

## 2015-10-23 ENCOUNTER — Encounter (HOSPITAL_COMMUNITY): Payer: Self-pay | Admitting: *Deleted

## 2015-10-23 ENCOUNTER — Emergency Department (HOSPITAL_COMMUNITY): Payer: 59

## 2015-10-23 ENCOUNTER — Emergency Department (HOSPITAL_COMMUNITY)
Admission: EM | Admit: 2015-10-23 | Discharge: 2015-10-24 | Disposition: A | Discharge status: Home / Self Care | Payer: 59 | Attending: Emergency Medicine | Admitting: Emergency Medicine

## 2015-10-23 DIAGNOSIS — Z79899 Other long term (current) drug therapy: Secondary | ICD-10-CM | POA: Insufficient documentation

## 2015-10-23 DIAGNOSIS — Z7951 Long term (current) use of inhaled steroids: Secondary | ICD-10-CM | POA: Insufficient documentation

## 2015-10-23 DIAGNOSIS — F329 Major depressive disorder, single episode, unspecified: Secondary | ICD-10-CM | POA: Diagnosis not present

## 2015-10-23 DIAGNOSIS — R1030 Lower abdominal pain, unspecified: Secondary | ICD-10-CM | POA: Diagnosis present

## 2015-10-23 DIAGNOSIS — N23 Unspecified renal colic: Secondary | ICD-10-CM | POA: Insufficient documentation

## 2015-10-23 DIAGNOSIS — N201 Calculus of ureter: Secondary | ICD-10-CM | POA: Insufficient documentation

## 2015-10-23 DIAGNOSIS — F17210 Nicotine dependence, cigarettes, uncomplicated: Secondary | ICD-10-CM | POA: Diagnosis not present

## 2015-10-23 LAB — URINE MICROSCOPIC-ADD ON

## 2015-10-23 LAB — URINALYSIS, ROUTINE W REFLEX MICROSCOPIC
Bilirubin Urine: NEGATIVE
Glucose, UA: NEGATIVE mg/dL
KETONES UR: NEGATIVE mg/dL
LEUKOCYTES UA: NEGATIVE
NITRITE: NEGATIVE
PH: 6.5 (ref 5.0–8.0)
PROTEIN: NEGATIVE mg/dL
Specific Gravity, Urine: 1.027 (ref 1.005–1.030)

## 2015-10-23 MED ORDER — KETOROLAC TROMETHAMINE 30 MG/ML IJ SOLN
30.0000 mg | Freq: Once | INTRAMUSCULAR | Status: AC
  Administered 2015-10-24: 30 mg via INTRAVENOUS
  Filled 2015-10-23: qty 1

## 2015-10-23 MED ORDER — SODIUM CHLORIDE 0.9 % IV BOLUS (SEPSIS)
1000.0000 mL | Freq: Once | INTRAVENOUS | Status: AC
  Administered 2015-10-24: 1000 mL via INTRAVENOUS

## 2015-10-23 MED ORDER — HYDROMORPHONE HCL 1 MG/ML IJ SOLN
0.5000 mg | Freq: Once | INTRAMUSCULAR | Status: DC
  Filled 2015-10-23: qty 1

## 2015-10-23 NOTE — ED Notes (Signed)
Pt has a neurostimulator in place for his spine.

## 2015-10-23 NOTE — ED Notes (Signed)
Pt complains of left groin pain for the past 3 days. Pt states he has a kidney stone that he feels has moved. Pt states he had blood in his urine today.

## 2015-10-24 LAB — I-STAT CHEM 8, ED
BUN: 10 mg/dL (ref 6–20)
CHLORIDE: 102 mmol/L (ref 101–111)
Calcium, Ion: 1.17 mmol/L (ref 1.12–1.23)
Creatinine, Ser: 0.9 mg/dL (ref 0.61–1.24)
Glucose, Bld: 86 mg/dL (ref 65–99)
HCT: 45 % (ref 39.0–52.0)
Hemoglobin: 15.3 g/dL (ref 13.0–17.0)
POTASSIUM: 3.6 mmol/L (ref 3.5–5.1)
SODIUM: 142 mmol/L (ref 135–145)
TCO2: 26 mmol/L (ref 0–100)

## 2015-10-24 MED ORDER — HYDROMORPHONE HCL 1 MG/ML IJ SOLN
1.0000 mg | Freq: Once | INTRAMUSCULAR | Status: AC
  Administered 2015-10-24: 1 mg via INTRAVENOUS

## 2015-10-24 MED ORDER — OXYCODONE-ACETAMINOPHEN 5-325 MG PO TABS: 1.0000 | ORAL_TABLET | ORAL | Status: DC | PRN

## 2015-10-24 MED ORDER — KETOROLAC TROMETHAMINE 10 MG PO TABS: 10.0000 mg | ORAL_TABLET | Freq: Four times a day (QID) | ORAL | Status: DC | PRN

## 2015-10-24 MED ORDER — HYDROMORPHONE HCL 2 MG/ML IJ SOLN
1.5000 mg | Freq: Once | INTRAMUSCULAR | Status: AC
  Administered 2015-10-24: 1.5 mg via INTRAVENOUS
  Filled 2015-10-24: qty 1

## 2015-10-24 NOTE — Discharge Instructions (Signed)
Kidney Stones °Kidney stones (urolithiasis) are deposits that form inside your kidneys. The intense pain is caused by the stone moving through the urinary tract. When the stone moves, the ureter goes into spasm around the stone. The stone is usually passed in the urine.  °CAUSES  °· A disorder that makes certain neck glands produce too much parathyroid hormone (primary hyperparathyroidism). °· A buildup of uric acid crystals, similar to gout in your joints. °· Narrowing (stricture) of the ureter. °· A kidney obstruction present at birth (congenital obstruction). °· Previous surgery on the kidney or ureters. °· Numerous kidney infections. °SYMPTOMS  °· Feeling sick to your stomach (nauseous). °· Throwing up (vomiting). °· Blood in the urine (hematuria). °· Pain that usually spreads (radiates) to the groin. °· Frequency or urgency of urination. °DIAGNOSIS  °· Taking a history and physical exam. °· Blood or urine tests. °· CT scan. °· Occasionally, an examination of the inside of the urinary bladder (cystoscopy) is performed. °TREATMENT  °· Observation. °· Increasing your fluid intake. °· Extracorporeal shock wave lithotripsy--This is a noninvasive procedure that uses shock waves to break up kidney stones. °· Surgery may be needed if you have severe pain or persistent obstruction. There are various surgical procedures. Most of the procedures are performed with the use of small instruments. Only small incisions are needed to accommodate these instruments, so recovery time is minimized. °The size, location, and chemical composition are all important variables that will determine the proper choice of action for you. Talk to your health care provider to better understand your situation so that you will minimize the risk of injury to yourself and your kidney.  °HOME CARE INSTRUCTIONS  °· Drink enough water and fluids to keep your urine clear or pale yellow. This will help you to pass the stone or stone fragments. °· Strain  all urine through the provided strainer. Keep all particulate matter and stones for your health care provider to see. The stone causing the pain may be as small as a grain of salt. It is very important to use the strainer each and every time you pass your urine. The collection of your stone will allow your health care provider to analyze it and verify that a stone has actually passed. The stone analysis will often identify what you can do to reduce the incidence of recurrences. °· Only take over-the-counter or prescription medicines for pain, discomfort, or fever as directed by your health care provider. °· Keep all follow-up visits as told by your health care provider. This is important. °· Get follow-up X-rays if required. The absence of pain does not always mean that the stone has passed. It may have only stopped moving. If the urine remains completely obstructed, it can cause loss of kidney function or even complete destruction of the kidney. It is your responsibility to make sure X-rays and follow-ups are completed. Ultrasounds of the kidney can show blockages and the status of the kidney. Ultrasounds are not associated with any radiation and can be performed easily in a matter of minutes. °· Make changes to your daily diet as told by your health care provider. You may be told to: °¨ Limit the amount of salt that you eat. °¨ Eat 5 or more servings of fruits and vegetables each day. °¨ Limit the amount of meat, poultry, fish, and eggs that you eat. °· Collect a 24-hour urine sample as told by your health care provider. You may need to collect another urine sample every 6-12   months. °SEEK MEDICAL CARE IF: °· You experience pain that is progressive and unresponsive to any pain medicine you have been prescribed. °SEEK IMMEDIATE MEDICAL CARE IF:  °· Pain cannot be controlled with the prescribed medicine. °· You have a fever or shaking chills. °· The severity or intensity of pain increases over 18 hours and is not  relieved by pain medicine. °· You develop a new onset of abdominal pain. °· You feel faint or pass out. °· You are unable to urinate. °  °This information is not intended to replace advice given to you by your health care provider. Make sure you discuss any questions you have with your health care provider. °  °Document Released: 04/17/2005 Document Revised: 01/06/2015 Document Reviewed: 09/18/2012 °Elsevier Interactive Patient Education ©2016 [employer] ° °

## 2015-10-24 NOTE — ED Provider Notes (Signed)
CSN: [id number]     Arrival date & time 10/23/15  1940 History   First MD Initiated Contact with Patient 10/23/15 2258     Chief Complaint  Patient presents with  . Groin Pain     (Consider location/radiation/quality/duration/timing/severity/associated sxs/prior Treatment) HPI Comments: 35 y.o. Male with history of kidney stones, chronic back pain presents for left groin pain.  The patient reports that since he was diagnosed with a kidney stone back in February he has experienced intermittent pain from his left back radiating to his groin but reports that severe pain and blood in his urine developed over the last 3 days.  He denies fever, chills, nausea, vomiting.  Normal bowel and bladder patterns.    Patient is a 35 y.o. male presenting with groin pain.  Groin Pain Associated symptoms include abdominal pain (left lower abdomen). Pertinent negatives include no chest pain, no headaches and no shortness of breath.    Past Medical History  Diagnosis Date  . Depression   . Anxiety   . Chronic back pain   . Headache(784.0)   . Bronchitis    Past Surgical History  Procedure Laterality Date  . Back surgery    . Hernia repair     No family history on file. Social History  Substance Use Topics  . Smoking status: Current Every Day Smoker -- 1.00 packs/day for 16 years    Types: Cigarettes  . Smokeless tobacco: None  . Alcohol Use: No    Review of Systems  Constitutional: Negative for fever, chills and fatigue.  HENT: Negative for congestion and postnasal drip.   Respiratory: Negative for cough, chest tightness and shortness of breath.   Cardiovascular: Negative for chest pain and palpitations.  Gastrointestinal: Positive for abdominal pain (left lower abdomen). Negative for nausea, vomiting and diarrhea.  Genitourinary: Positive for hematuria. Negative for dysuria, urgency, frequency, penile swelling, scrotal swelling and testicular pain.  Musculoskeletal: Negative for myalgias  and back pain.  Skin: Negative for rash.  Neurological: Negative for dizziness, weakness and headaches.  Hematological: Does not bruise/bleed easily.      Allergies  Wellbutrin  Home Medications   Prior to Admission medications   Medication Sig Start Date End Date Taking? Authorizing Provider  albuterol (PROVENTIL HFA;VENTOLIN HFA) 108 (90 BASE) MCG/ACT inhaler Inhale 2 puffs into the lungs every 6 (six) hours as needed for wheezing. 12/18/14   Merlyn AlbertWilliam S Luking, MD  atorvastatin (LIPITOR) 20 MG tablet Take 1 tablet (20 mg total) by mouth at bedtime. 09/14/15   Merlyn AlbertWilliam S Luking, MD  ciprofloxacin (CIPRO) 500 MG tablet Take 1 tablet (500 mg total) by mouth 2 (two) times daily. Patient not taking: Reported on 09/14/2015 05/24/15   Merlyn AlbertWilliam S Luking, MD  cyclobenzaprine (FLEXERIL) 10 MG tablet Take 10 mg by mouth 3 (three) times daily as needed for muscle spasms.    Historical Provider, MD  diazepam (VALIUM) 5 MG tablet Take 5 mg by mouth as directed. Reported on 05/24/2015 11/17/14   Historical Provider, MD  HYDROcodone-acetaminophen (NORCO) 10-325 MG tablet Take 1 tablet by mouth every 12 (twelve) hours as needed. 09/14/15   Merlyn AlbertWilliam S Luking, MD  ketorolac (TORADOL) 10 MG tablet Take 1 tablet (10 mg total) by mouth every 6 (six) hours as needed for moderate pain or severe pain. 10/24/15   Leta BaptistEmily Roe Selim Durden, MD  Magnesium Oxide (MAG-CAPS PO) Take 1,500 mg by mouth daily.    Historical Provider, MD  Naproxen-Esomeprazole (VIMOVO) 500-20 MG TBEC Take 1 tablet  by mouth 2 (two) times daily.     Historical Provider, MD  ondansetron (ZOFRAN ODT) 4 MG disintegrating tablet Take 1 tablet (4 mg total) by mouth every 8 (eight) hours as needed for nausea or vomiting. Patient not taking: Reported on 09/14/2015 05/28/15   Layla MawKristen N Ward, DO  oxyCODONE-acetaminophen (PERCOCET/ROXICET) 5-325 MG tablet Take 1-2 tablets by mouth every 4 (four) hours as needed for severe pain. 10/24/15   Leta BaptistEmily Roe Jaleigh Mccroskey, MD  tamsulosin  (FLOMAX) 0.4 MG CAPS capsule Take 1 capsule (0.4 mg total) by mouth daily. Patient not taking: Reported on 09/14/2015 05/28/15   Kristen N Ward, DO  tiZANidine (ZANAFLEX) 4 MG tablet Take 4 mg by mouth every 6 (six) hours as needed for muscle spasms. Reported on 09/14/2015    Historical Provider, MD   BP 132/84 mmHg  Pulse 73  Temp(Src) 98.1 F (36.7 C) (Oral)  Resp 18  SpO2 98% Physical Exam  Constitutional: He is oriented to person, place, and time. He appears well-developed and well-nourished. No distress.  HENT:  Head: Normocephalic and atraumatic.  Right Ear: External ear normal.  Left Ear: External ear normal.  Mouth/Throat: Oropharynx is clear and moist. No oropharyngeal exudate.  Eyes: EOM are normal. Pupils are equal, round, and reactive to light.  Neck: Normal range of motion. Neck supple.  Cardiovascular: Normal rate, regular rhythm, normal heart sounds and intact distal pulses.   No murmur heard. Pulmonary/Chest: Effort normal. No respiratory distress. He has no wheezes. He has no rales.  Abdominal: Soft. He exhibits no distension. There is tenderness in the left lower quadrant. There is no CVA tenderness.  Musculoskeletal: He exhibits no edema.  Neurological: He is alert and oriented to person, place, and time.  Skin: Skin is warm and dry. No rash noted. He is not diaphoretic.  Vitals reviewed.   ED Course  Procedures (including critical care time) Labs Review Labs Reviewed  URINALYSIS, ROUTINE W REFLEX MICROSCOPIC ([facility]) - Abnormal; Notable for the following:    Hgb urine dipstick LARGE (*)    All other components within normal limits  URINE MICROSCOPIC-ADD ON - Abnormal; Notable for the following:    Squamous Epithelial / LPF 0-5 (*)    Bacteria, UA RARE (*)    Crystals CA OXALATE CRYSTALS (*)    All other components within normal limits  I-STAT CHEM 8, ED    Imaging Review Ct Renal Stone Study  10/23/2015  CLINICAL DATA:  35 year old male with left  flank pain EXAM: CT ABDOMEN AND PELVIS WITHOUT CONTRAST TECHNIQUE: Multidetector CT imaging of the abdomen and pelvis was performed following the standard protocol without IV contrast. COMPARISON:  CT dated 05/28/2015 FINDINGS: Evaluation of this exam is limited in the absence of intravenous contrast. The visualized lung bases are clear. No intra-abdominal free air or free fluid. The liver, gallbladder, pancreas, and adrenal glands appear unremarkable. Stable appearing top-normal spleen size measuring up to 14 cm in length. There is a 4 mm stone in the distal left ureter adjacent to the left UVJ. No hydronephrosis. The right kidney, right ureter, and urinary bladder appear unremarkable. The prostate and seminal vesicles are grossly unremarkable. There scattered sigmoid diverticula without active inflammatory changes. There is moderate stool throughout the colon. No evidence of bowel obstruction or active inflammation. Normal appendix. The abdominal aorta and IVC appear unremarkable on this noncontrast study. There is a circumaortic left renal vein anatomy. No portal venous gas identified. There is no adenopathy. There is a  small fat containing umbilical hernia. The abdominal wall soft tissues are otherwise unremarkable. L5 the laminectomy, L4-L5 disc spacer and posterior fusion hardware noted. No acute fracture. The A spinal stimulator device noted with tip at the level of mid thoracic spine. IMPRESSION: A 4 mm distal left ureteral/ left UVJ stone.  No hydronephrosis. Electronically Signed   By: Elgie Collard M.D.   On: 10/23/2015 23:52   I have personally reviewed and evaluated these images and lab results as part of my medical decision-making.   EKG Interpretation None      MDM  Patient seen and evaluated in stable condition.  UA without sign of infection but with significant RBCs.  Other labs unremarkable.  CT with 4 mm non obstructing stone in the distal ureter.  Patient's pain controlled with  Dilaudid and toradol.  He was discharged home in stable condition with strict return precautions and prescriptions for Percocet and Toradol.  He was instructed to follow up with urology. Final diagnoses:  Renal colic  Ureteral stone    1. Renal colic  2. Ureteral calculus    Leta Baptist, MD 10/24/15 0200

## 2015-10-27 ENCOUNTER — Ambulatory Visit (INDEPENDENT_AMBULATORY_CARE_PROVIDER_SITE_OTHER): Payer: 59 | Admitting: Urology

## 2015-10-27 ENCOUNTER — Other Ambulatory Visit (HOSPITAL_COMMUNITY)
Admission: RE | Admit: 2015-10-27 | Discharge: 2015-10-27 | Disposition: A | Discharge status: Home / Self Care | Payer: 59 | Source: Other Acute Inpatient Hospital | Attending: Urology | Admitting: Urology

## 2015-10-27 DIAGNOSIS — N201 Calculus of ureter: Secondary | ICD-10-CM

## 2015-10-27 DIAGNOSIS — N200 Calculus of kidney: Secondary | ICD-10-CM | POA: Insufficient documentation

## 2015-10-27 LAB — URINE MICROSCOPIC-ADD ON

## 2015-10-27 LAB — URINALYSIS, ROUTINE W REFLEX MICROSCOPIC
BILIRUBIN URINE: NEGATIVE
GLUCOSE, UA: NEGATIVE mg/dL
KETONES UR: NEGATIVE mg/dL
Leukocytes, UA: NEGATIVE
Nitrite: NEGATIVE
PH: 6 (ref 5.0–8.0)
Protein, ur: NEGATIVE mg/dL
Specific Gravity, Urine: >1.03 — ABNORMAL HIGH (ref 1.005–1.030)

## 2015-11-03 ENCOUNTER — Ambulatory Visit (INDEPENDENT_AMBULATORY_CARE_PROVIDER_SITE_OTHER): Payer: 59 | Admitting: Urology

## 2015-11-03 ENCOUNTER — Other Ambulatory Visit (HOSPITAL_COMMUNITY)
Admission: RE | Admit: 2015-11-03 | Discharge: 2015-11-03 | Disposition: A | Discharge status: Home / Self Care | Payer: 59 | Source: Ambulatory Visit | Attending: Urology | Admitting: Urology

## 2015-11-03 DIAGNOSIS — N201 Calculus of ureter: Secondary | ICD-10-CM | POA: Diagnosis not present

## 2015-11-05 ENCOUNTER — Other Ambulatory Visit: Payer: Self-pay | Admitting: Urology

## 2015-11-05 ENCOUNTER — Other Ambulatory Visit (HOSPITAL_COMMUNITY): Payer: 59

## 2015-11-05 DIAGNOSIS — N201 Calculus of ureter: Secondary | ICD-10-CM

## 2015-11-10 ENCOUNTER — Ambulatory Visit (HOSPITAL_COMMUNITY): Admission: RE | Admit: 2015-11-10 | Payer: 59 | Source: Ambulatory Visit | Admitting: Urology

## 2015-11-10 ENCOUNTER — Encounter (HOSPITAL_COMMUNITY): Admission: RE | Payer: Self-pay | Source: Ambulatory Visit

## 2015-11-10 SURGERY — CYSTOSCOPY, WITH CALCULUS REMOVAL USING BASKET
Anesthesia: General | Laterality: Left

## 2015-11-17 LAB — STONE ANALYSIS
CA OXALATE, DIHYDRATE: 70 %
CA OXALATE, MONOHYDR.: 25 %
CA PHOS CRY STONE QL IR: 5 %
Stone Weight KSTONE: 32 mg

## 2016-04-14 ENCOUNTER — Other Ambulatory Visit: Payer: Self-pay | Admitting: Urology

## 2016-04-14 DIAGNOSIS — N201 Calculus of ureter: Secondary | ICD-10-CM

## 2016-04-22 ENCOUNTER — Other Ambulatory Visit: Payer: Self-pay | Admitting: Family Medicine

## 2016-09-06 ENCOUNTER — Emergency Department (HOSPITAL_COMMUNITY): Payer: Medicaid Other

## 2016-09-06 ENCOUNTER — Encounter (HOSPITAL_COMMUNITY): Payer: Self-pay | Admitting: Adult Health

## 2016-09-06 ENCOUNTER — Emergency Department (HOSPITAL_COMMUNITY)
Admission: EM | Admit: 2016-09-06 | Discharge: 2016-09-06 | Disposition: A | Discharge status: Home / Self Care | Payer: Medicaid Other | Attending: Emergency Medicine | Admitting: Emergency Medicine

## 2016-09-06 DIAGNOSIS — F17210 Nicotine dependence, cigarettes, uncomplicated: Secondary | ICD-10-CM | POA: Insufficient documentation

## 2016-09-06 DIAGNOSIS — R0789 Other chest pain: Secondary | ICD-10-CM | POA: Diagnosis present

## 2016-09-06 DIAGNOSIS — R05 Cough: Secondary | ICD-10-CM | POA: Diagnosis not present

## 2016-09-06 DIAGNOSIS — Z79899 Other long term (current) drug therapy: Secondary | ICD-10-CM | POA: Insufficient documentation

## 2016-09-06 DIAGNOSIS — M549 Dorsalgia, unspecified: Secondary | ICD-10-CM | POA: Insufficient documentation

## 2016-09-06 DIAGNOSIS — R079 Chest pain, unspecified: Secondary | ICD-10-CM

## 2016-09-06 LAB — CBC WITH DIFFERENTIAL/PLATELET
Basophils Absolute: 0.1 10*3/uL (ref 0.0–0.1)
Basophils Relative: 1 %
Eosinophils Absolute: 0.7 10*3/uL (ref 0.0–0.7)
Eosinophils Relative: 8 %
HEMATOCRIT: 48.5 % (ref 39.0–52.0)
HEMOGLOBIN: 17.1 g/dL — AB (ref 13.0–17.0)
LYMPHS ABS: 1.9 10*3/uL (ref 0.7–4.0)
LYMPHS PCT: 23 %
MCH: 30.5 pg (ref 26.0–34.0)
MCHC: 35.3 g/dL (ref 30.0–36.0)
MCV: 86.5 fL (ref 78.0–100.0)
MONOS PCT: 6 %
Monocytes Absolute: 0.5 10*3/uL (ref 0.1–1.0)
NEUTROS PCT: 62 %
Neutro Abs: 5.1 10*3/uL (ref 1.7–7.7)
Platelets: 172 10*3/uL (ref 150–400)
RBC: 5.61 MIL/uL (ref 4.22–5.81)
RDW: 12.7 % (ref 11.5–15.5)
WBC: 8.2 10*3/uL (ref 4.0–10.5)

## 2016-09-06 LAB — BASIC METABOLIC PANEL
Anion gap: 9 (ref 5–15)
BUN: 15 mg/dL (ref 6–20)
CHLORIDE: 103 mmol/L (ref 101–111)
CO2: 27 mmol/L (ref 22–32)
CREATININE: 0.9 mg/dL (ref 0.61–1.24)
Calcium: 9.3 mg/dL (ref 8.9–10.3)
GFR calc Af Amer: >60 mL/min (ref >60)
GFR calc non Af Amer: >60 mL/min (ref >60)
GLUCOSE: 96 mg/dL (ref 65–99)
POTASSIUM: 3.9 mmol/L (ref 3.5–5.1)
Sodium: 139 mmol/L (ref 135–145)

## 2016-09-06 LAB — D-DIMER, QUANTITATIVE: D-Dimer, Quant: <0.27 ug/mL-FEU (ref 0.00–0.50)

## 2016-09-06 LAB — TROPONIN I

## 2016-09-06 MED ORDER — ONDANSETRON HCL 4 MG/2ML IJ SOLN
4.0000 mg | Freq: Once | INTRAMUSCULAR | Status: AC
  Administered 2016-09-06: 4 mg via INTRAVENOUS
  Filled 2016-09-06: qty 2

## 2016-09-06 MED ORDER — HYDROMORPHONE HCL 1 MG/ML IJ SOLN
1.0000 mg | Freq: Once | INTRAMUSCULAR | Status: AC
  Administered 2016-09-06: 1 mg via INTRAVENOUS
  Filled 2016-09-06: qty 1

## 2016-09-06 MED ORDER — SODIUM CHLORIDE 0.9 % IV SOLN
INTRAVENOUS | Status: DC
  Administered 2016-09-06: 15:00:00 via INTRAVENOUS

## 2016-09-06 MED ORDER — SODIUM CHLORIDE 0.9 % IV BOLUS (SEPSIS)
500.0000 mL | Freq: Once | INTRAVENOUS | Status: AC
  Administered 2016-09-06: 500 mL via INTRAVENOUS

## 2016-09-06 NOTE — ED Provider Notes (Signed)
[facility] Provider Note   CSN: [id number] Arrival date & time: 09/06/16  1100     History   Chief Complaint Chief Complaint  Patient presents with  . Flank Pain    HPI Noah Vasquez is a 36 y.o. male.  Patient with complaint of right lateral and posterior chest CVA pain. Patient has a history of chronic back pain followed by pain management. This is different pain. Made worse by movement made worse by taking a deep breath associated with some shortness of breath. No anterior abdominal pain. No hematuria. No fevers. No rash.  Symptoms have been present since yesterday. They have been constant.      Past Medical History:  Diagnosis Date  . Anxiety   . Bronchitis   . Chronic back pain   . Depression   . WUJWJXBJ(478.2)     Patient Active Problem List   Diagnosis Date Noted  . Hyperlipidemia 09/04/2014  . 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 DEPRESSION 09/19/2007  . CONSTIPATION 09/19/2007  . SMOKER 09/06/2007  . DISC DISEASE, LUMBOSACRAL SPINE 09/06/2007  . INSOMNIA 09/06/2007    Past Surgical History:  Procedure Laterality Date  . BACK SURGERY    . HERNIA REPAIR         Home Medications    Prior to Admission medications   Medication Sig Start Date End Date Taking? Authorizing Provider  albuterol (PROVENTIL HFA;VENTOLIN HFA) 108 (90 BASE) MCG/ACT inhaler Inhale 2 puffs into the lungs every 6 (six) hours as needed for wheezing. 12/18/14  Yes Merlyn Albert, MD  atorvastatin (LIPITOR) 20 MG tablet TAKE ONE TABLET BY MOUTH AT BEDTIME. 04/25/16  Yes Merlyn Albert, MD  carvedilol (COREG) 6.25 MG tablet Take 6.25 mg by mouth 2 (two) times daily with a meal.   Yes [provider]  cyclobenzaprine (FLEXERIL) 10 MG tablet Take 10 mg by mouth 3 (three) times daily as needed for muscle spasms.   Yes [provider]  dexlansoprazole (DEXILANT) 60 MG capsule Take 60 mg by mouth at bedtime.   Yes [provider]  DULoxetine (CYMBALTA) 20 MG capsule Take 20 mg by mouth daily.   Yes [provider]  FLUoxetine (PROZAC) 20 MG capsule Take 20 mg by mouth daily.   Yes [provider]  naproxen (NAPROSYN) 500 MG tablet Take 500 mg by mouth 2 (two) times daily with a meal.   Yes [provider]  oxyCODONE-acetaminophen (PERCOCET) 7.5-325 MG tablet Take 1 tablet by mouth every 8 (eight) hours as needed for severe pain.   Yes [provider]    Family History History reviewed. No pertinent family history.  Social History Social History  Substance Use Topics  . Smoking status: Current Every Day Smoker    Packs/day: 1.00    Years: 16.00    Types: Cigarettes  . Smokeless tobacco: Not on file  . Alcohol use No     Allergies   Wellbutrin [bupropion] and Bee venom   Review of Systems Review of Systems  Constitutional: Negative for fever.  HENT: Negative for congestion.   Eyes: Negative for visual disturbance.  Respiratory: Positive for shortness of breath.   Cardiovascular: Positive for chest pain.  Gastrointestinal: Negative for abdominal pain.  Genitourinary: Positive for flank pain.  Musculoskeletal: Positive for back pain.  Skin: Negative for rash and wound.  Neurological: Negative for headaches.  Hematological: Does not bruise/bleed easily.  Psychiatric/Behavioral: Negative for confusion.     Physical  Exam Updated Vital Signs BP 117/83 (BP Location: Right Arm)   Pulse 78   Temp 97.6 F (36.4 C) (Oral)   Resp 18   Ht 1.803 m (5\' 11" )   Wt 112.5 kg (248 lb)   SpO2 99%   BMI 34.59 kg/m   Physical Exam  Constitutional: He appears well-developed and well-nourished. He appears distressed.  HENT:  Head: Normocephalic and atraumatic.  Eyes: Conjunctivae and EOM are normal. Pupils are equal, round, and reactive to light.  Neck: Normal range of motion. Neck supple.  Cardiovascular: Normal rate and regular rhythm.   Pulmonary/Chest:  Effort normal and breath sounds normal. He exhibits tenderness.  Some tenderness to palpation to the back and right lateral posterior rib area.  Abdominal: Soft. Bowel sounds are normal. There is no tenderness.  Musculoskeletal: Normal range of motion.  Neurological: He is alert. No cranial nerve deficit or sensory deficit. He exhibits normal muscle tone. Coordination normal.  Skin: Skin is warm. No rash noted. No erythema.  Nursing note and vitals reviewed.    ED Treatments / Results  Labs (all labs ordered are listed, but only abnormal results are displayed) Labs Reviewed  CBC WITH DIFFERENTIAL/PLATELET - Abnormal; Notable for the following:       Result Value   Hemoglobin 17.1 (*)    All other components within normal limits  BASIC METABOLIC PANEL  TROPONIN I  D-DIMER, QUANTITATIVE (NOT AT [facility])   Results for orders placed or performed during the hospital encounter of 09/06/16  CBC with Differential/Platelet  Result Value Ref Range   WBC 8.2 4.0 - 10.5 K/uL   RBC 5.61 4.22 - 5.81 MIL/uL   Hemoglobin 17.1 (H) 13.0 - 17.0 g/dL   HCT 16.1 09.6 - 04.5 %   MCV 86.5 78.0 - 100.0 fL   MCH 30.5 26.0 - 34.0 pg   MCHC 35.3 30.0 - 36.0 g/dL   RDW 40.9 81.1 - 91.4 %   Platelets 172 150 - 400 K/uL   Neutrophils Relative % 62 %   Neutro Abs 5.1 1.7 - 7.7 K/uL   Lymphocytes Relative 23 %   Lymphs Abs 1.9 0.7 - 4.0 K/uL   Monocytes Relative 6 %   Monocytes Absolute 0.5 0.1 - 1.0 K/uL   Eosinophils Relative 8 %   Eosinophils Absolute 0.7 0.0 - 0.7 K/uL   Basophils Relative 1 %   Basophils Absolute 0.1 0.0 - 0.1 K/uL  Basic metabolic panel  Result Value Ref Range   Sodium 139 135 - 145 mmol/L   Potassium 3.9 3.5 - 5.1 mmol/L   Chloride 103 101 - 111 mmol/L   CO2 27 22 - 32 mmol/L   Glucose, Bld 96 65 - 99 mg/dL   BUN 15 6 - 20 mg/dL   Creatinine, Ser 7.82 0.61 - 1.24 mg/dL   Calcium 9.3 8.9 - 95.6 mg/dL   GFR calc non Af Amer >60 >60 mL/min   GFR calc Af Amer >60 >60 mL/min    Anion gap 9 5 - 15  Troponin I  Result Value Ref Range   Troponin I <0.03 <0.03 ng/mL  D-dimer, quantitative (not at [facility])  Result Value Ref Range   D-Dimer, Quant <0.27 0.00 - 0.50 ug/mL-FEU   Dg Ribs Unilateral W/chest Right  Result Date: 09/06/2016 CLINICAL DATA:  Right-sided pain underneath the axilla. EXAM: RIGHT RIBS AND CHEST - 3+ VIEW COMPARISON:  01/29/2013 chest x-ray FINDINGS: No fracture or other bone lesions are seen involving the ribs.  There is no evidence of pneumothorax or pleural effusion. Both lungs are clear. Heart size and mediastinal contours are within normal limits. Dorsal column stimulator leads over the thoracic spine. IMPRESSION: Negative. Electronically Signed   By: Marnee SpringJonathon  Watts M.D.   On: 09/06/2016 15:21     EKG  EKG Interpretation  Date/Time:  Wednesday Sep 06 2016 14:39:58 EDT Ventricular Rate:  62 PR Interval:    QRS Duration: 95 QT Interval:  419 QTC Calculation: 426 R Axis:   64 Text Interpretation:  Sinus rhythm No previous ECGs available Confirmed by Lenoir Facchini  MD, Nicki Gracy ([id number]) on 09/06/2016 2:49:19 PM       Radiology No results found.  Procedures Procedures (including critical care time)  Medications Ordered in ED Medications  sodium chloride 0.9 % bolus 500 mL (0 mLs Intravenous Stopped 09/06/16 1515)  ondansetron (ZOFRAN) injection 4 mg (4 mg Intravenous Given 09/06/16 1423)  HYDROmorphone (DILAUDID) injection 1 mg (1 mg Intravenous Given 09/06/16 1423)  HYDROmorphone (DILAUDID) injection 1 mg (1 mg Intravenous Given 09/06/16 1724)     Initial Impression / Assessment and Plan / ED Course  I have reviewed the triage vital signs and the nursing notes.  Pertinent labs & imaging results that were available during my care of the patient were reviewed by me and considered in my medical decision making (see chart for details).    Workup here today without any acute findings to explain the chest pain. Does seem to be consistent with muscle  skeletal or chest wall pain. Follow-up with your doctors including pain management. Patient is followed by pain management. Patient not able to have a prescription for pain medicine based on his pain management situation.  Patient's troponin was negative EKG without acute changes. Chest x-ray without acute findings. D-dimer negative. Patient's pain improved here with hydromorphone.   Final Clinical Impressions(s) / ED Diagnoses   Final diagnoses:  Right-sided chest pain  Chest wall pain    New Prescriptions Discharge Medication List as of 09/06/2016  5:00 PM       Vanetta MuldersZackowski, Mariangel Ringley, MD 09/26/16 [phone]

## 2016-09-06 NOTE — ED Provider Notes (Signed)
[facility] Provider Note   CSN: [id number] Arrival date & time: 09/06/16  1100   By signing my name below, I, Bobbie Stackhristopher Reid, attest that this documentation has been prepared under the direction and in the presence of Vanetta MuldersZackowski, Celines Femia, MD. Electronically Signed: Bobbie Stackhristopher Reid, Scribe. 09/06/16. 2:01 PM. History   Chief Complaint Chief Complaint  Patient presents with  . Flank Pain    The history is provided by the patient. No language interpreter was used.  Chest Pain   This is a new problem. The current episode started 12 to 24 hours ago. The pain is present in the lateral region (right). The pain is at a severity of 10/10. The pain is severe. The pain does not radiate. Associated symptoms include back pain and cough. Pertinent negatives include no fever, no nausea, no numbness and no vomiting.  HPI Comments: Noah CanalesJamie A Vasquez is a 36 y.o. male who presents to the Emergency Department complaining of right lateral chest pain for the past 3 to 4 days. He rates his pain 10/10. Pain worsens with movement and coughing. He states that his pain became worse yesterday and kept him up throughout the entire night. He tried taking muscle relaxers and pain medications with no relief. He denies any recent injuries. He denies numbness or any fevers recently.   Past Medical History:  Diagnosis Date  . Anxiety   . Bronchitis   . Chronic back pain   . Depression   . WUJWJXBJ(478.2Headache(784.0)     Patient Active Problem List   Diagnosis Date Noted  . Hyperlipidemia 09/04/2014  . 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 DEPRESSION 09/19/2007  . CONSTIPATION 09/19/2007  . SMOKER 09/06/2007  . DISC DISEASE, LUMBOSACRAL SPINE 09/06/2007  . INSOMNIA 09/06/2007    Past Surgical History:  Procedure Laterality Date  . BACK SURGERY    . HERNIA REPAIR         Home Medications    Prior to Admission medications   Medication Sig Start Date End Date Taking? Authorizing Provider    albuterol (PROVENTIL HFA;VENTOLIN HFA) 108 (90 BASE) MCG/ACT inhaler Inhale 2 puffs into the lungs every 6 (six) hours as needed for wheezing. 12/18/14   Merlyn AlbertLuking, William S, MD  atorvastatin (LIPITOR) 20 MG tablet TAKE ONE TABLET BY MOUTH AT BEDTIME. 04/25/16   Merlyn AlbertLuking, William S, MD  ciprofloxacin (CIPRO) 500 MG tablet Take 1 tablet (500 mg total) by mouth 2 (two) times daily. Patient not taking: Reported on 09/14/2015 05/24/15   Merlyn AlbertLuking, William S, MD  cyclobenzaprine (FLEXERIL) 10 MG tablet Take 10 mg by mouth 3 (three) times daily as needed for muscle spasms.    [provider]  diazepam (VALIUM) 5 MG tablet Take 5 mg by mouth as directed. Reported on 05/24/2015 11/17/14   [provider]  HYDROcodone-acetaminophen (NORCO) 10-325 MG tablet Take 1 tablet by mouth every 12 (twelve) hours as needed. 09/14/15   Merlyn AlbertLuking, William S, MD  ketorolac (TORADOL) 10 MG tablet Take 1 tablet (10 mg total) by mouth every 6 (six) hours as needed for moderate pain or severe pain. 10/24/15   Leta BaptistNguyen, Emily Roe, MD  Magnesium Oxide (MAG-CAPS PO) Take 1,500 mg by mouth daily.    [provider]  Naproxen-Esomeprazole (VIMOVO) 500-20 MG TBEC Take 1 tablet by mouth 2 (two) times daily.     [provider]  ondansetron (ZOFRAN ODT) 4 MG disintegrating tablet Take 1 tablet (4 mg total) by mouth every 8 (eight) hours as needed  for nausea or vomiting. Patient not taking: Reported on 09/14/2015 05/28/15   Ward, Layla Maw, DO  oxyCODONE-acetaminophen (PERCOCET/ROXICET) 5-325 MG tablet Take 1-2 tablets by mouth every 4 (four) hours as needed for severe pain. 10/24/15   Leta Baptist, MD  tamsulosin (FLOMAX) 0.4 MG CAPS capsule Take 1 capsule (0.4 mg total) by mouth daily. Patient not taking: Reported on 09/14/2015 05/28/15   Ward, Layla Maw, DO  tiZANidine (ZANAFLEX) 4 MG tablet Take 4 mg by mouth every 6 (six) hours as needed for muscle spasms. Reported on 09/14/2015    [provider]     Family History History reviewed. No pertinent family history.  Social History Social History  Substance Use Topics  . Smoking status: Current Every Day Smoker    Packs/day: 1.00    Years: 16.00    Types: Cigarettes  . Smokeless tobacco: Not on file  . Alcohol use No     Allergies   Wellbutrin [bupropion]   Review of Systems Review of Systems  Constitutional: Negative for chills and fever.  HENT: Negative for rhinorrhea and sore throat.   Eyes: Negative for visual disturbance.  Respiratory: Positive for cough.   Cardiovascular: Positive for chest pain.  Gastrointestinal: Negative for diarrhea, nausea and vomiting.  Genitourinary: Negative for dysuria and hematuria.  Musculoskeletal: Positive for back pain. Negative for joint swelling.  Skin: Negative for rash.  Neurological: Negative for numbness.  Hematological: Does not bruise/bleed easily.  Psychiatric/Behavioral: Negative for confusion.  All other systems reviewed and are negative.    Physical Exam Updated Vital Signs BP 112/81 (BP Location: Left Arm)   Pulse 78   Temp 98.7 F (37.1 C) (Oral)   Resp 18   Ht 5\' 11"  (1.803 m)   Wt 248 lb (112.5 kg)   SpO2 98%   BMI 34.59 kg/m   Physical Exam  Constitutional: He is oriented to person, place, and time. He appears well-developed and well-nourished.  HENT:  Head: Normocephalic.  Mouth/Throat: Mucous membranes are normal.  Eyes: EOM are normal.  Neck: Normal range of motion.  Cardiovascular: Normal rate and regular rhythm.   Pulses:      Radial pulses are 2+ on the right side.  Pulmonary/Chest: Effort normal and breath sounds normal. No respiratory distress. He exhibits no tenderness.  Non-tender on chest, left or right.  Abdominal: Soft. He exhibits no distension. There is no tenderness.  Musculoskeletal: Normal range of motion.  Neurological: He is alert and oriented to person, place, and time. No cranial nerve deficit or sensory deficit. He  exhibits normal muscle tone. Coordination normal.  Psychiatric: He has a normal mood and affect.  Nursing note and vitals reviewed.  ED Treatments / Results  DIAGNOSTIC STUDIES: Oxygen Saturation is 98% on RA, normal by my interpretation.    COORDINATION OF CARE: 1:30 PM Discussed treatment plan with pt at bedside and pt agreed to plan. I will check the x-ray of his right ribs and his EKG.  Labs (all labs ordered are listed, but only abnormal results are displayed) Labs Reviewed  CBC WITH DIFFERENTIAL/PLATELET  BASIC METABOLIC PANEL  TROPONIN I  D-DIMER, QUANTITATIVE (NOT AT [facility])    EKG  EKG Interpretation None       Radiology No results found.  Procedures Procedures (including critical care time)  Medications Ordered in ED Medications  0.9 %  sodium chloride infusion (not administered)  sodium chloride 0.9 % bolus 500 mL (not administered)  ondansetron (ZOFRAN) injection 4 mg (not  administered)  HYDROmorphone (DILAUDID) injection 1 mg (not administered)     Initial Impression / Assessment and Plan / ED Course  I have reviewed the triage vital signs and the nursing notes.  Pertinent labs & imaging results that were available during my care of the patient were reviewed by me and considered in my medical decision making (see chart for details).    Workup for the chest pain which clinically seemed to be musculoskeletal chest wall in nature without any acute findings. D-dimer negative troponin negative chest x-ray negative. Patient's on pain management so cannot have additional pain medicine however treated pain here with a total of 2 mg of hydromorphone. Patient with some improvement. Patient nontoxic no acute distress.   Final Clinical Impressions(s) / ED Diagnoses   Final diagnoses:  Right-sided chest pain    New Prescriptions New Prescriptions   No medications on file   I personally performed the services described in this documentation, which was scribed  in my presence. The recorded information has been reviewed and is accurate.      Vanetta Mulders, MD 09/06/16 [phone]

## 2016-09-06 NOTE — Discharge Instructions (Signed)
Workup here today without any acute findings to explain the chest pain. Seems to be consistent with musculoskeletal or chest wall pain. Follow-up with your doctors including pain management. Return for any new or worse symptoms.

## 2016-09-06 NOTE — ED Notes (Signed)
Patient states he awoke at 0200 with severe rt lateral chest wall pain. Denies injury or pain on inspiration.  States pain is relieved with positioning.

## 2016-09-06 NOTE — ED Triage Notes (Signed)
Presents with onset of right sided pain underneath axilla and down to last rib, began yesterday and kept patient up all night depsite using muscle relaxers and pain medication. Endorses mild SOB and pain with deep inspiration. Denies painful urination. Denies cough or productive cough.

## 2016-09-06 NOTE — ED Notes (Signed)
Discharge instructions reviewed and patient given an opportunity to ask questions. Patient requests EKG electrodes be left on for him to remove.

## 2017-08-30 ENCOUNTER — Other Ambulatory Visit (HOSPITAL_COMMUNITY): Payer: Self-pay | Admitting: Respiratory Therapy

## 2017-08-30 DIAGNOSIS — R0602 Shortness of breath: Secondary | ICD-10-CM

## 2017-09-06 ENCOUNTER — Inpatient Hospital Stay (HOSPITAL_COMMUNITY): Admission: RE | Admit: 2017-09-06 | Payer: Self-pay | Source: Ambulatory Visit

## 2019-05-12 ENCOUNTER — Other Ambulatory Visit: Payer: Self-pay

## 2019-05-12 ENCOUNTER — Ambulatory Visit
Admission: EM | Admit: 2019-05-12 | Discharge: 2019-05-12 | Disposition: A | Discharge status: Home / Self Care | Payer: Medicaid Other

## 2019-05-12 DIAGNOSIS — Z20822 Contact with and (suspected) exposure to covid-19: Secondary | ICD-10-CM | POA: Diagnosis not present

## 2019-05-12 NOTE — ED Provider Notes (Signed)
[facility]    CSN: [id number] Arrival date & time: 05/12/19  0908      History   Chief Complaint No chief complaint on file.   HPI Noah Vasquez is a 39 y.o. male.   Noah Vasquez 39 years old male presented to urgent care for Covid testing after Covid exposure the past week.  Denies sick exposure to flu or strep.  Denies recent travel.  Denies aggravating or alleviating symptoms.  Denies previous COVID infection.   Denies fever, chills, fatigue, nasal congestion, rhinorrhea, sore throat, cough, SOB, wheezing, chest pain, nausea, vomiting, changes in bowel or bladder habits.    The history is provided by the patient. No language interpreter was used.    Past Medical History:  Diagnosis Date  . Anxiety   . Bronchitis   . Chronic back pain   . Depression   . NKNLZJQB(341.9)     Patient Active Problem List   Diagnosis Date Noted  . Hyperlipidemia 09/04/2014  . 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 DEPRESSION 09/19/2007  . CONSTIPATION 09/19/2007  . SMOKER 09/06/2007  . DISC DISEASE, LUMBOSACRAL SPINE 09/06/2007  . INSOMNIA 09/06/2007    Past Surgical History:  Procedure Laterality Date  . BACK SURGERY    . HERNIA REPAIR         Home Medications    Prior to Admission medications   Medication Sig Start Date End Date Taking? Authorizing Provider  albuterol (PROVENTIL HFA;VENTOLIN HFA) 108 (90 BASE) MCG/ACT inhaler Inhale 2 puffs into the lungs every 6 (six) hours as needed for wheezing. 12/18/14   Merlyn Albert, MD  atorvastatin (LIPITOR) 20 MG tablet TAKE ONE TABLET BY MOUTH AT BEDTIME. 04/25/16   Merlyn Albert, MD  carvedilol (COREG) 6.25 MG tablet Take 6.25 mg by mouth 2 (two) times daily with a meal.    [provider]  cyclobenzaprine (FLEXERIL) 10 MG tablet Take 10 mg by mouth 3 (three) times daily as needed for muscle spasms.    [provider]  dexlansoprazole (DEXILANT) 60 MG capsule Take 60 mg by  mouth at bedtime.    [provider]  DULoxetine (CYMBALTA) 20 MG capsule Take 20 mg by mouth daily.    [provider]  FLUoxetine (PROZAC) 20 MG capsule Take 20 mg by mouth daily.    [provider]  GRALISE 600 MG TABS Take 3 tablets by mouth at bedtime. 04/22/19   [provider]  naproxen (NAPROSYN) 500 MG tablet Take 500 mg by mouth 2 (two) times daily with a meal.    [provider]  oxyCODONE-acetaminophen (PERCOCET) 7.5-325 MG tablet Take 1 tablet by mouth every 8 (eight) hours as needed for severe pain.    [provider]    Family History History reviewed. No pertinent family history.  Social History Social History   Tobacco Use  . Smoking status: Current Every Day Smoker    Packs/day: 1.00    Years: 16.00    Pack years: 16.00    Types: Cigarettes  Substance Use Topics  . Alcohol use: No    Alcohol/week: 0.0 standard drinks  . Drug use: No     Allergies   Wellbutrin [bupropion] and Bee venom   Review of Systems Review of Systems  Constitutional: Negative.   HENT: Negative.   Respiratory: Negative.   Cardiovascular: Negative.   Gastrointestinal: Negative.   Neurological: Negative.      Physical Exam Triage Vital Signs ED Triage  Vitals  Enc Vitals Group     BP 05/12/19 0927 130/77     Pulse Rate 05/12/19 0927 85     Resp 05/12/19 0927 18     Temp 05/12/19 0927 98.7 F (37.1 C)     Temp src --      SpO2 05/12/19 0927 96 %     Weight --      Height --      Head Circumference --      Peak Flow --      Pain Score 05/12/19 0923 0     Pain Loc --      Pain Edu? --      Excl. in [place]? --    No data found.  Updated Vital Signs BP 130/77   Pulse 85   Temp 98.7 F (37.1 C)   Resp 18   SpO2 96%   Visual Acuity Right Eye Distance:   Left Eye Distance:   Bilateral Distance:    Right Eye Near:   Left Eye Near:    Bilateral Near:     Physical Exam Vitals and nursing note reviewed.    Constitutional:      General: He is not in acute distress.    Appearance: Normal appearance. He is normal weight. He is not ill-appearing or toxic-appearing.  HENT:     Head: Normocephalic.     Right Ear: Tympanic membrane, ear canal and external ear normal. There is no impacted cerumen.     Left Ear: Tympanic membrane, ear canal and external ear normal. There is no impacted cerumen.     Nose: Nose normal. No congestion.     Mouth/Throat:     Mouth: Mucous membranes are moist.     Pharynx: No oropharyngeal exudate or posterior oropharyngeal erythema.  Cardiovascular:     Rate and Rhythm: Normal rate and regular rhythm.     Pulses: Normal pulses.     Heart sounds: Normal heart sounds. No murmur.  Pulmonary:     Effort: Pulmonary effort is normal. No respiratory distress.     Breath sounds: Normal breath sounds. No wheezing or rhonchi.  Chest:     Chest wall: No tenderness.  Abdominal:     General: Abdomen is flat. Bowel sounds are normal. There is no distension.     Palpations: There is no mass.  Skin:    Capillary Refill: Capillary refill takes less than 2 seconds.  Neurological:     Mental Status: He is alert and oriented to person, place, and time.      UC Treatments / Results  Labs (all labs ordered are listed, but only abnormal results are displayed) Labs Reviewed  NOVEL CORONAVIRUS, NAA    EKG   Radiology No results found.  Procedures Procedures (including critical care time)  Medications Ordered in UC Medications - No data to display  Initial Impression / Assessment and Plan / UC Course  I have reviewed the triage vital signs and the nursing notes.  Pertinent labs & imaging results that were available during my care of the patient were reviewed by me and considered in my medical decision making (see chart for details).    COVID-19 test was ordered.  Patient is stable for discharge in no acute distress.  Advised patient to quarantine until COVID-19 test  result become available.  To go to ED for worsening of symptoms.  Patient verbalized understanding the plan of care.  Final Clinical Impressions(s) / UC Diagnoses   Final  diagnoses:  Suspected COVID-19 virus infection     Discharge Instructions     COVID testing ordered.  It will take between 2-7 days for test results.  Someone will contact you regarding abnormal results.    In the meantime: You should remain isolated in your home for 10 days from symptom onset AND greater than 72 hours after symptoms resolution (absence of fever without the use of fever-reducing medication and improvement in respiratory symptoms), whichever is longer Get plenty of rest and push fluids Use medications daily for symptom relief Use OTC medications like ibuprofen or tylenol as needed fever or pain Call or go to the ED if you have any new or worsening symptoms such as fever, worsening cough, shortness of breath, chest tightness, chest pain, turning blue, changes in mental status, etc...     ED Prescriptions    None     PDMP not reviewed this encounter.   Durward Parcel, FNP 05/12/19 [phone]

## 2019-05-12 NOTE — ED Triage Notes (Signed)
Pt here for covid testing after positive exposure  

## 2019-05-12 NOTE — Discharge Instructions (Signed)

## 2019-05-13 LAB — NOVEL CORONAVIRUS, NAA: SARS-CoV-2, NAA: NOT DETECTED

## 2019-05-30 ENCOUNTER — Encounter: Payer: Self-pay | Admitting: Orthopedic Surgery

## 2019-05-30 ENCOUNTER — Ambulatory Visit: Payer: Medicaid Other | Admitting: Orthopedic Surgery

## 2019-05-30 ENCOUNTER — Other Ambulatory Visit: Payer: Self-pay

## 2019-05-30 VITALS — BP 130/93 | HR 86 | Temp 98.1°F | Ht 69.0 in | Wt 229.0 lb

## 2019-05-30 DIAGNOSIS — Z720 Tobacco use: Secondary | ICD-10-CM | POA: Diagnosis not present

## 2019-05-30 DIAGNOSIS — G5603 Carpal tunnel syndrome, bilateral upper limbs: Secondary | ICD-10-CM | POA: Diagnosis not present

## 2019-05-30 NOTE — Patient Instructions (Signed)
Carpal Tunnel Syndrome  Carpal tunnel syndrome is a condition that causes pain in your hand and arm. The carpal tunnel is a narrow area located on the palm side of your wrist. Repeated wrist motion or certain diseases may cause swelling within the tunnel. This swelling pinches the main nerve in the wrist (median nerve). What are the causes? This condition may be caused by:  Repeated wrist motions.  Wrist injuries.  Arthritis.  A cyst or tumor in the carpal tunnel.  Fluid buildup during pregnancy. Sometimes the cause of this condition is not known. What increases the risk? The following factors may make you more likely to develop this condition:  Having a job, such as being a butcher or a cashier, that requires you to repeatedly move your wrist in the same motion.  Being a woman.  Having certain conditions, such as: ? Diabetes. ? Obesity. ? An underactive thyroid (hypothyroidism). ? Kidney failure. What are the signs or symptoms? Symptoms of this condition include:  A tingling feeling in your fingers, especially in your thumb, index, and middle fingers.  Tingling or numbness in your hand.  An aching feeling in your entire arm, especially when your wrist and elbow are bent for a long time.  Wrist pain that goes up your arm to your shoulder.  Pain that goes down into your palm or fingers.  A weak feeling in your hands. You may have trouble grabbing and holding items. Your symptoms may feel worse during the night. How is this diagnosed? This condition is diagnosed with a medical history and physical exam. You may also have tests, including:  Electromyogram (EMG). This test measures electrical signals sent by your nerves into the muscles.  Nerve conduction study. This test measures how well electrical signals pass through your nerves.  Imaging tests, such as X-rays, ultrasound, and MRI. These tests check for possible causes of your condition. How is this treated? This  condition may be treated with:  Lifestyle changes. It is important to stop or change the activity that caused your condition.  Doing exercise and activities to strengthen your muscles and bones (physical therapy).  Learning how to use your hand again after diagnosis (occupational therapy).  Medicines for pain and inflammation. This may include medicine that is injected into your wrist.  A wrist splint.  Surgery. Follow these instructions at home: If you have a splint:  Wear the splint as told by your health care provider. Remove it only as told by your health care provider.  Loosen the splint if your fingers tingle, become numb, or turn cold and blue.  Keep the splint clean.  If the splint is not waterproof: ? Do not let it get wet. ? Cover it with a watertight covering when you take a bath or shower. Managing pain, stiffness, and swelling   If directed, put ice on the painful area: ? If you have a removable splint, remove it as told by your health care provider. ? Put ice in a plastic bag. ? Place a towel between your skin and the bag. ? Leave the ice on for 20 minutes, 2-3 times per day. General instructions  Take over-the-counter and prescription medicines only as told by your health care provider.  Rest your wrist from any activity that may be causing your pain. If your condition is work related, talk with your employer about changes that can be made, such as getting a wrist pad to use while typing.  Do any exercises as told   by your health care provider, physical therapist, or occupational therapist.  Keep all follow-up visits as told by your health care provider. This is important. Contact a health care provider if:  You have new symptoms.  Your pain is not controlled with medicines.  Your symptoms get worse. Get help right away if:  You have severe numbness or tingling in your wrist or hand. Summary  Carpal tunnel syndrome is a condition that causes pain in  your hand and arm.  It is usually caused by repeated wrist motions.  Lifestyle changes and medicines are used to treat carpal tunnel syndrome. Surgery may be recommended.  Follow your health care provider's instructions about wearing a splint, resting from activity, keeping follow-up visits, and calling for help. This information is not intended to replace advice given to you by your health care provider. Make sure you discuss any questions you have with your health care provider. Document Revised: 08/24/2017 Document Reviewed: 08/24/2017 Elsevier Patient Education  2020 [employer]  

## 2019-05-30 NOTE — Progress Notes (Signed)
Noah Vasquez  05/30/2019  Body mass index is 33.82 kg/m.   HISTORY SECTION :  Chief Complaint  Patient presents with  . Carpal Tunnel    Bilateral CTS.   39 year old male presents with the referral from [facility] neurology with positive nerve conduction studies for right and left carpal tunnel syndrome.  He said symptoms for years prior treatment has included multiple injections he been on anti-inflammatories as well as gabapentin  He does have chronic pain from back surgery and has a neurostimulator in place.  He says he is dropping things he is having pain in his long ring and small finger which radiate up into his wrist right is worse than left   Review of Systems  Musculoskeletal: Positive for back pain.  All other systems reviewed and are negative.    has a past medical history of Anxiety, Bronchitis, Chronic back pain, Depression, and Headache(784.0).   Past Surgical History:  Procedure Laterality Date  . BACK SURGERY    . HERNIA REPAIR      Body mass index is 33.82 kg/m.   Allergies  Allergen Reactions  . Wellbutrin [Bupropion] Other (See Comments)    Makes pt mean  . Bee Venom Swelling     Current Outpatient Medications:  .  carvedilol (COREG) 6.25 MG tablet, Take 6.25 mg by mouth 2 (two) times daily with a meal., Disp: , Rfl:  .  cyclobenzaprine (FLEXERIL) 10 MG tablet, Take 10 mg by mouth 3 (three) times daily as needed for muscle spasms., Disp: , Rfl:  .  dexlansoprazole (DEXILANT) 60 MG capsule, Take 60 mg by mouth at bedtime., Disp: , Rfl:  .  FLUoxetine (PROZAC) 20 MG capsule, Take 20 mg by mouth daily., Disp: , Rfl:  .  GRALISE 600 MG TABS, Take 3 tablets by mouth at bedtime., Disp: , Rfl:  .  naproxen (NAPROSYN) 500 MG tablet, Take 500 mg by mouth 2 (two) times daily with a meal., Disp: , Rfl:  .  oxyCODONE-acetaminophen (PERCOCET) 7.5-325 MG tablet, Take 1 tablet by mouth every 8 (eight) hours as needed for severe pain., Disp: , Rfl:  .   albuterol (PROVENTIL HFA;VENTOLIN HFA) 108 (90 BASE) MCG/ACT inhaler, Inhale 2 puffs into the lungs every 6 (six) hours as needed for wheezing. (Patient not taking: Reported on 05/30/2019), Disp: 1 Inhaler, Rfl: 5 .  atorvastatin (LIPITOR) 20 MG tablet, TAKE ONE TABLET BY MOUTH AT BEDTIME. (Patient not taking: Reported on 05/30/2019), Disp: 30 tablet, Rfl: 0 .  DULoxetine (CYMBALTA) 20 MG capsule, Take 20 mg by mouth daily., Disp: , Rfl:    PHYSICAL EXAM SECTION: 1) BP (!) 130/93   Pulse 86   Temp 98.1 F (36.7 C)   Ht 5\' 9"  (1.753 m)   Wt 229 lb (103.9 kg)   BMI 33.82 kg/m   Body mass index is 33.82 kg/m. General appearance: Well-developed well-nourished no gross deformities  2) Cardiovascular normal pulse and perfusion , normal color   3) Neurologically deep tendon reflexes are equal and normal,  4) Psychological: Awake alert and oriented x3 mood and affect normal  5) Skin no lacerations or ulcerations no nodularity no palpable masses, no erythema or nodularity  6) Musculoskeletal:  Decreased sensation in the long and ring finger on the right and as well as the left has tenderness over the carpal tunnel positive compression test tenderness in the palm and distal wrist.  Movement range of motion strength stability are intact with equal grip strength.  Color  capillary refill normal palm is dry somewhat on both sides   MEDICAL DECISION MAKING  A. No diagnosis found.  B. DATA ANALYSED:  IMAGING: Independent interpretation of images: NO  Orders: No Outside records reviewed: Records from Dr. Gerilyn Pilgrim indicate treatment for chronic pain as well as carpal tunnel syndrome he also has a nerve conduction study which I will read into the record.  The left and right median palmar responses show prolonged latencies additionally amplitudes are reduced the left and right ulnar palmar responses are normal the left median motor potential showed use prolonged latency the amplitudes were normal  the right median motor potential shows slightly reduced amplitudes the distal latency and conduction velocity normal the left and right ulnar motor potentials are normal needle EMG shows no degeneration    C. MANAGEMENT right carpal tunnel release  The procedure has been fully reviewed with the patient; The risks and benefits of surgery have been discussed and explained and understood. Alternative treatment has also been reviewed, questions were encouraged and answered. The postoperative plan is also been reviewed.   No orders of the defined types were placed in this encounter.     Fuller Canada, MD  05/30/2019 9:08 AM

## 2019-06-03 ENCOUNTER — Telehealth: Payer: Self-pay | Admitting: Radiology

## 2019-06-03 NOTE — Telephone Encounter (Signed)
-----   Message from Paulo Fruit sent at 06/02/2019 12:26 PM EST ----- Regarding: RE: Unable to reach pt Okay, thank you ----- Message ----- From: Caffie Damme, RT Sent: 06/02/2019  11:25 AM EST To: Marcelle Smiling Faint Subject: RE: Unable to reach pt                         He said his number may be changing, he will let us know today what the new number is, I will let you know.  ----- Message ----- From: Paulo Fruit Sent: 06/02/2019  11:04 AM EST To: Caffie Damme, RT Subject: Unable to reach pt                             Good Morning Loreley Schwall,  I just wanted to let you know I haven't been able to reach the above patient to schedule his preop and covid appts. The number I have for him is [phone], just want to make sure that is the correct number.   Thank you,  Beryle Quant.

## 2019-06-03 NOTE — Telephone Encounter (Signed)
Cant reach him by phone preop 06/10/19 11:30 covid 06/10/19 11am Hopefully he will call today with a new phone number   I sent him a mychart message.

## 2019-06-03 NOTE — Telephone Encounter (Signed)
Patient wants to RS surgery to April 1st, I have advised Jeani Hawking

## 2019-06-10 ENCOUNTER — Encounter (HOSPITAL_COMMUNITY): Payer: Medicaid Other

## 2019-06-10 ENCOUNTER — Other Ambulatory Visit (HOSPITAL_COMMUNITY): Payer: Medicaid Other

## 2019-07-12 DIAGNOSIS — G894 Chronic pain syndrome: Secondary | ICD-10-CM | POA: Insufficient documentation

## 2019-07-12 DIAGNOSIS — M961 Postlaminectomy syndrome, not elsewhere classified: Secondary | ICD-10-CM | POA: Insufficient documentation

## 2019-07-15 DIAGNOSIS — M545 Low back pain, unspecified: Secondary | ICD-10-CM | POA: Insufficient documentation

## 2019-07-15 DIAGNOSIS — K219 Gastro-esophageal reflux disease without esophagitis: Secondary | ICD-10-CM | POA: Insufficient documentation

## 2019-07-15 DIAGNOSIS — F331 Major depressive disorder, recurrent, moderate: Secondary | ICD-10-CM | POA: Insufficient documentation

## 2019-07-15 DIAGNOSIS — M47816 Spondylosis without myelopathy or radiculopathy, lumbar region: Secondary | ICD-10-CM | POA: Insufficient documentation

## 2019-07-16 ENCOUNTER — Telehealth: Payer: Self-pay | Admitting: Radiology

## 2019-07-16 NOTE — Telephone Encounter (Signed)
I called patient about Moving surgery up a week Dr Romeo Apple will be out of the office on 07/31/19

## 2019-07-17 NOTE — Patient Instructions (Signed)
Noah Vasquez  07/17/2019     @PREFPERIOPPHARMACY @   Your procedure is scheduled on  07/24/2019 .  Report to 07/26/2019 at  0745  A.M.  Call this number if you have problems the morning of surgery:  [phone]   Remember:  Do not eat or drink after midnight.                        Take these medicines the morning of surgery with A SIP OF WATER  Carvedilol, flexeril, dexilant, cymbalta, prozac, oxycodone(if needed).    Do not wear jewelry, make-up or nail polish.  Do not wear lotions, powders, or perfumes. Please brush your teeth.  Do not shave 48 hours prior to surgery.  Men may shave face and neck.  Do not bring valuables to the hospital.  [facility] is not responsible for any belongings or valuables.  Contacts, dentures or bridgework may not be worn into surgery.  Leave your suitcase in the car.  After surgery it may be brought to your room.  For patients admitted to the hospital, discharge time will be determined by your treatment team.  Patients discharged the day of surgery will not be allowed to drive home.   Name and phone number of your driver:   Family Special instructions:  DO NOT smoke the day of your surgery.  Please read over the following fact sheets that you were given. Anesthesia Post-op Instructions and Care and Recovery After Surgery       Open Carpal Tunnel Release, Care After This sheet gives you information about how to care for yourself after your procedure. Your health care provider may also give you more specific instructions. If you have problems or questions, contact your health care provider. What can I expect after the procedure? After the procedure, it is common to have:  Wrist stiffness.  Bruising. Follow these instructions at home: Bathing  Do not take baths, swim, or use a hot tub until your health care provider approves. Ask your health care provider if you may take showers.  Keep your bandage (dressing) dry until  your health care provider says it can be removed. If you have a splint or brace:  Wear the splint or brace as told by your health care provider. You may need to wear it for 2-3 weeks. Remove it only as told by your health care provider.  Loosen the splint or brace if your fingers tingle, become numb, or turn cold and blue.  Keep the splint or brace clean.  If the splint or brace is not waterproof: ? Do not let it get wet. ? Cover it with a watertight covering when you take a bath or a shower. Incision care   Follow instructions from your health care provider about how to take care of your incision. Make sure you: ? Wash your hands with soap and water before you change your dressing. If soap and water are not available, use hand sanitizer. ? Change your dressing as told by your health care provider. ? Leave stitches (sutures), skin glue, or adhesive strips in place. These skin closures may need to stay in place for 2 weeks or longer. If adhesive strip edges start to loosen and curl up, you may trim the loose edges. Do not remove adhesive strips completely unless your health care provider tells you to do that.  Check your incision area every day for signs  of infection. Check for: ? Redness, swelling, or pain. ? Fluid or blood. ? Warmth. ? Pus or a bad smell. Managing pain, stiffness, and swelling   If directed, put ice on the affected area. ? If you have a removable splint or brace, remove it as told by your health care provider. ? Put ice in a plastic bag. ? Place a towel between your skin and the bag. ? Leave the ice on for 20 minutes, 2-3 times a day.  Move your fingers often to avoid stiffness and to lessen swelling.  Raise (elevate) your wrist above the level of your heart while you are sitting or lying down. Activity  Do not drive until your health care provider approves.  Do not drive or use heavy machinery while taking prescription pain medicine.  Return to your  normal activities as told by your health care provider. Avoid activities that cause pain.  If physical therapy was prescribed, do exercises as told by your therapist. Physical therapy can help you heal faster and regain movement. General instructions  Take over-the-counter and prescription medicines only as told by your health care provider.  If you are taking prescription pain medicine, take actions to prevent or treat constipation. Your health care provider may recommend that you: ? Drink enough fluid to keep your urine pale yellow. ? Eat foods that are high in fiber, such as fresh fruits and vegetables, whole grains, and beans. ? Limit foods that are high in fat and processed sugars, such as fried or sweet foods. ? Take an over-the-counter or prescription medicine for constipation.  Do not use any products that contain nicotine or tobacco, such as cigarettes and e-cigarettes. If you need help quitting, ask your health care provider.  Keep all follow-up visits as told by your health care provider and physical therapist. This is important. Contact a health care provider if:  You have redness or swelling around your incision.  You have fluid or blood coming from your incision.  Your incision feels warm to the touch.  You have pus or a bad smell coming from your incision.  You have a fever.  You have chills.  You have pain that does not get better with medicine.  Your carpal tunnel symptoms do not go away after 2 months.  Your carpal tunnel symptoms go away and then come back. Get help right away if:  You have pain or numbness that is getting worse.  Your fingers or fingertips become very pale or bluish in color.  You are not able to move your fingers. Summary  It is common to have wrist stiffness and bruising after a carpal tunnel release.  Icing and raising (elevating) your wrist may help to lessen swelling and pain.  Call your health care provider if you have a fever  or notice any signs of infection in your incision area. This information is not intended to replace advice given to you by your health care provider. Make sure you discuss any questions you have with your health care provider. Document Revised: 03/30/2017 Document Reviewed: 12/25/2016 Elsevier Patient Education  2020 [place] After These instructions provide you with information about caring for yourself after your procedure. Your health care provider may also give you more specific instructions. Your treatment has been planned according to current medical practices, but problems sometimes occur. Call your health care provider if you have any problems or questions after your procedure. What can I expect after the procedure?  After your procedure, you may:  Feel sleepy for several hours.  Feel clumsy and have poor balance for several hours.  Feel forgetful about what happened after the procedure.  Have poor judgment for several hours.  Feel nauseous or vomit.  Have a sore throat if you had a breathing tube during the procedure. Follow these instructions at home: For at least 24 hours after the procedure:      Have a responsible adult stay with you. It is important to have someone help care for you until you are awake and alert.  Rest as needed.  Do not: ? Participate in activities in which you could fall or become injured. ? Drive. ? Use heavy machinery. ? Drink alcohol. ? Take sleeping pills or medicines that cause drowsiness. ? Make important decisions or sign legal documents. ? Take care of children on your own. Eating and drinking  Follow the diet that is recommended by your health care provider.  If you vomit, drink water, juice, or soup when you can drink without vomiting.  Make sure you have little or no nausea before eating solid foods. General instructions  Take over-the-counter and prescription medicines only as told by  your health care provider.  If you have sleep apnea, surgery and certain medicines can increase your risk for breathing problems. Follow instructions from your health care provider about wearing your sleep device: ? Anytime you are sleeping, including during daytime naps. ? While taking prescription pain medicines, sleeping medicines, or medicines that make you drowsy.  If you smoke, do not smoke without supervision.  Keep all follow-up visits as told by your health care provider. This is important. Contact a health care provider if:  You keep feeling nauseous or you keep vomiting.  You feel light-headed.  You develop a rash.  You have a fever. Get help right away if:  You have trouble breathing. Summary  For several hours after your procedure, you may feel sleepy and have poor judgment.  Have a responsible adult stay with you for at least 24 hours or until you are awake and alert. This information is not intended to replace advice given to you by your health care provider. Make sure you discuss any questions you have with your health care provider. Document Revised: 07/16/2017 Document Reviewed: 08/08/2015 Elsevier Patient Education  2020 [employer]. How to Use Chlorhexidine for Bathing Chlorhexidine gluconate (CHG) is a germ-killing (antiseptic) solution that is used to clean the skin. It can get rid of the bacteria that normally live on the skin and can keep them away for about 24 hours. To clean your skin with CHG, you may be given:  A CHG solution to use in the shower or as part of a sponge bath.  A prepackaged cloth that contains CHG. Cleaning your skin with CHG may help lower the risk for infection:  While you are staying in the intensive care unit of the hospital.  If you have a vascular access, such as a central line, to provide short-term or long-term access to your veins.  If you have a catheter to drain urine from your bladder.  If you are on a ventilator. A  ventilator is a machine that helps you breathe by moving air in and out of your lungs.  After surgery. What are the risks? Risks of using CHG include:  A skin reaction.  Hearing loss, if CHG gets in your ears.  Eye injury, if CHG gets in your eyes and is not  rinsed out.  The CHG product catching fire. Make sure that you avoid smoking and flames after applying CHG to your skin. Do not use CHG:  If you have a chlorhexidine allergy or have previously reacted to chlorhexidine.  On babies younger than 432 months of age. How to use CHG solution  Use CHG only as told by your health care provider, and follow the instructions on the label.  Use the full amount of CHG as directed. Usually, this is one bottle. During a shower Follow these steps when using CHG solution during a shower (unless your health care provider gives you different instructions): 1. Start the shower. 2. Use your normal soap and shampoo to wash your face and hair. 3. Turn off the shower or move out of the shower stream. 4. Pour the CHG onto a clean washcloth. Do not use any type of brush or rough-edged sponge. 5. Starting at your neck, lather your body down to your toes. Make sure you follow these instructions: ? If you will be having surgery, pay special attention to the part of your body where you will be having surgery. Scrub this area for at least 1 minute. ? Do not use CHG on your head or face. If the solution gets into your ears or eyes, rinse them well with water. ? Avoid your genital area. ? Avoid any areas of skin that have broken skin, cuts, or scrapes. ? Scrub your back and under your arms. Make sure to wash skin folds. 6. Let the lather sit on your skin for 1-2 minutes or as long as told by your health care provider. 7. Thoroughly rinse your entire body in the shower. Make sure that all body creases and crevices are rinsed well. 8. Dry off with a clean towel. Do not put any substances on your body  afterward--such as powder, lotion, or perfume--unless you are told to do so by your health care provider. Only use lotions that are recommended by the manufacturer. 9. Put on clean clothes or pajamas. 10. If it is the night before your surgery, sleep in clean sheets.  During a sponge bath Follow these steps when using CHG solution during a sponge bath (unless your health care provider gives you different instructions): 1. Use your normal soap and shampoo to wash your face and hair. 2. Pour the CHG onto a clean washcloth. 3. Starting at your neck, lather your body down to your toes. Make sure you follow these instructions: ? If you will be having surgery, pay special attention to the part of your body where you will be having surgery. Scrub this area for at least 1 minute. ? Do not use CHG on your head or face. If the solution gets into your ears or eyes, rinse them well with water. ? Avoid your genital area. ? Avoid any areas of skin that have broken skin, cuts, or scrapes. ? Scrub your back and under your arms. Make sure to wash skin folds. 4. Let the lather sit on your skin for 1-2 minutes or as long as told by your health care provider. 5. Using a different clean, wet washcloth, thoroughly rinse your entire body. Make sure that all body creases and crevices are rinsed well. 6. Dry off with a clean towel. Do not put any substances on your body afterward--such as powder, lotion, or perfume--unless you are told to do so by your health care provider. Only use lotions that are recommended by the manufacturer. 7. Put on clean  clothes or pajamas. 8. If it is the night before your surgery, sleep in clean sheets. How to use CHG prepackaged cloths  Only use CHG cloths as told by your health care provider, and follow the instructions on the label.  Use the CHG cloth on clean, dry skin.  Do not use the CHG cloth on your head or face unless your health care provider tells you to.  When washing with  the CHG cloth: ? Avoid your genital area. ? Avoid any areas of skin that have broken skin, cuts, or scrapes. Before surgery Follow these steps when using a CHG cloth to clean before surgery (unless your health care provider gives you different instructions): 1. Using the CHG cloth, vigorously scrub the part of your body where you will be having surgery. Scrub using a back-and-forth motion for 3 minutes. The area on your body should be completely wet with CHG when you are done scrubbing. 2. Do not rinse. Discard the cloth and let the area air-dry. Do not put any substances on the area afterward, such as powder, lotion, or perfume. 3. Put on clean clothes or pajamas. 4. If it is the night before your surgery, sleep in clean sheets.  For general bathing Follow these steps when using CHG cloths for general bathing (unless your health care provider gives you different instructions). 1. Use a separate CHG cloth for each area of your body. Make sure you wash between any folds of skin and between your fingers and toes. Wash your body in the following order, switching to a new cloth after each step: ? The front of your neck, shoulders, and chest. ? Both of your arms, under your arms, and your hands. ? Your stomach and groin area, avoiding the genitals. ? Your right leg and foot. ? Your left leg and foot. ? The back of your neck, your back, and your buttocks. 2. Do not rinse. Discard the cloth and let the area air-dry. Do not put any substances on your body afterward--such as powder, lotion, or perfume--unless you are told to do so by your health care provider. Only use lotions that are recommended by the manufacturer. 3. Put on clean clothes or pajamas. Contact a health care provider if:  Your skin gets irritated after scrubbing.  You have questions about using your solution or cloth. Get help right away if:  Your eyes become very red or swollen.  Your eyes itch badly.  Your skin itches badly  and is red or swollen.  Your hearing changes.  You have trouble seeing.  You have swelling or tingling in your mouth or throat.  You have trouble breathing.  You swallow any chlorhexidine. Summary  Chlorhexidine gluconate (CHG) is a germ-killing (antiseptic) solution that is used to clean the skin. Cleaning your skin with CHG may help to lower your risk for infection.  You may be given CHG to use for bathing. It may be in a bottle or in a prepackaged cloth to use on your skin. Carefully follow your health care provider's instructions and the instructions on the product label.  Do not use CHG if you have a chlorhexidine allergy.  Contact your health care provider if your skin gets irritated after scrubbing. This information is not intended to replace advice given to you by your health care provider. Make sure you discuss any questions you have with your health care provider. Document Revised: 07/04/2018 Document Reviewed: 03/15/2017 Elsevier Patient Education  2020 [employer].

## 2019-07-22 ENCOUNTER — Encounter (HOSPITAL_COMMUNITY)
Admission: RE | Admit: 2019-07-22 | Discharge: 2019-07-22 | Disposition: A | Discharge status: Home / Self Care | Payer: Medicaid Other | Source: Ambulatory Visit | Attending: Orthopedic Surgery | Admitting: Orthopedic Surgery

## 2019-07-22 ENCOUNTER — Other Ambulatory Visit: Payer: Self-pay

## 2019-07-22 ENCOUNTER — Other Ambulatory Visit (HOSPITAL_COMMUNITY)
Admission: RE | Admit: 2019-07-22 | Discharge: 2019-07-22 | Disposition: A | Discharge status: Home / Self Care | Payer: Medicaid Other | Source: Ambulatory Visit | Attending: Orthopedic Surgery | Admitting: Orthopedic Surgery

## 2019-07-22 ENCOUNTER — Encounter (HOSPITAL_COMMUNITY): Payer: Self-pay

## 2019-07-22 DIAGNOSIS — Z01818 Encounter for other preprocedural examination: Secondary | ICD-10-CM | POA: Diagnosis not present

## 2019-07-22 DIAGNOSIS — Z20822 Contact with and (suspected) exposure to covid-19: Secondary | ICD-10-CM | POA: Diagnosis not present

## 2019-07-22 DIAGNOSIS — I10 Essential (primary) hypertension: Secondary | ICD-10-CM | POA: Diagnosis not present

## 2019-07-22 HISTORY — DX: Essential (primary) hypertension: I10

## 2019-07-22 LAB — CBC WITH DIFFERENTIAL/PLATELET
Abs Immature Granulocytes: 0.01 10*3/uL (ref 0.00–0.07)
Basophils Absolute: 0.1 10*3/uL (ref 0.0–0.1)
Basophils Relative: 1 %
Eosinophils Absolute: 0.5 10*3/uL (ref 0.0–0.5)
Eosinophils Relative: 6 %
HCT: 46.7 % (ref 39.0–52.0)
Hemoglobin: 15.8 g/dL (ref 13.0–17.0)
Immature Granulocytes: 0 %
Lymphocytes Relative: 28 %
Lymphs Abs: 2.2 10*3/uL (ref 0.7–4.0)
MCH: 29.1 pg (ref 26.0–34.0)
MCHC: 33.8 g/dL (ref 30.0–36.0)
MCV: 86 fL (ref 80.0–100.0)
Monocytes Absolute: 0.5 10*3/uL (ref 0.1–1.0)
Monocytes Relative: 7 %
Neutro Abs: 4.5 10*3/uL (ref 1.7–7.7)
Neutrophils Relative %: 58 %
Platelets: 259 10*3/uL (ref 150–400)
RBC: 5.43 MIL/uL (ref 4.22–5.81)
RDW: 12.7 % (ref 11.5–15.5)
WBC: 7.8 10*3/uL (ref 4.0–10.5)
nRBC: 0 % (ref 0.0–0.2)

## 2019-07-22 LAB — SARS CORONAVIRUS 2 (TAT 6-24 HRS): SARS Coronavirus 2: NEGATIVE

## 2019-07-23 NOTE — H&P (Signed)
HISTORY SECTION :      Chief Complaint  Patient presents with  . Carpal Tunnel    Bilateral CTS.   39 year old male presents with the referral from [facility] neurology with positive nerve conduction studies for right and left carpal tunnel syndrome.  He said symptoms for years prior treatment has included multiple injections he been on anti-inflammatories as well as gabapentin  He does have chronic pain from back surgery and has a neurostimulator in place.  He says he is dropping things he is having pain in his long ring and small finger which radiate up into his wrist right is worse than left   Review of Systems  Musculoskeletal: Positive for back pain.  All other systems reviewed and are negative.    has a past medical history of Anxiety, Bronchitis, Chronic back pain, Depression, and Headache(784.0).        Past Surgical History:  Procedure Laterality Date  . BACK SURGERY    . HERNIA REPAIR      Body mass index is 33.82 kg/m.        Allergies  Allergen Reactions  . Wellbutrin [Bupropion] Other (See Comments)    Makes pt mean  . Bee Venom Swelling     Current Outpatient Medications:  .  carvedilol (COREG) 6.25 MG tablet, Take 6.25 mg by mouth 2 (two) times daily with a meal., Disp: , Rfl:  .  cyclobenzaprine (FLEXERIL) 10 MG tablet, Take 10 mg by mouth 3 (three) times daily as needed for muscle spasms., Disp: , Rfl:  .  dexlansoprazole (DEXILANT) 60 MG capsule, Take 60 mg by mouth at bedtime., Disp: , Rfl:  .  FLUoxetine (PROZAC) 20 MG capsule, Take 20 mg by mouth daily., Disp: , Rfl:  .  GRALISE 600 MG TABS, Take 3 tablets by mouth at bedtime., Disp: , Rfl:  .  naproxen (NAPROSYN) 500 MG tablet, Take 500 mg by mouth 2 (two) times daily with a meal., Disp: , Rfl:  .  oxyCODONE-acetaminophen (PERCOCET) 7.5-325 MG tablet, Take 1 tablet by mouth every 8 (eight) hours as needed for severe pain., Disp: , Rfl:  .  albuterol (PROVENTIL  HFA;VENTOLIN HFA) 108 (90 BASE) MCG/ACT inhaler, Inhale 2 puffs into the lungs every 6 (six) hours as needed for wheezing. (Patient not taking: Reported on 05/30/2019), Disp: 1 Inhaler, Rfl: 5 .  atorvastatin (LIPITOR) 20 MG tablet, TAKE ONE TABLET BY MOUTH AT BEDTIME. (Patient not taking: Reported on 05/30/2019), Disp: 30 tablet, Rfl: 0 .  DULoxetine (CYMBALTA) 20 MG capsule, Take 20 mg by mouth daily., Disp: , Rfl:    PHYSICAL EXAM SECTION: 1) BP (!) 130/93   Pulse 86   Temp 98.1 F (36.7 C)   Ht 5\' 9"  (1.753 m)   Wt 229 lb (103.9 kg)   BMI 33.82 kg/m   Body mass index is 33.82 kg/m. General appearance: Well-developed well-nourished no gross deformities  2) Cardiovascular normal pulse and perfusion , normal color   3) Neurologically deep tendon reflexes are equal and normal,  4) Psychological: Awake alert and oriented x3 mood and affect normal  5) Skin no lacerations or ulcerations no nodularity no palpable masses, no erythema or nodularity  6) Musculoskeletal:  Decreased sensation in the long and ring finger on the right and as well as the left has tenderness over the carpal tunnel positive compression test tenderness in the palm and distal wrist.  Movement range of motion strength stability are intact with equal grip strength.  Color  capillary refill normal palm is dry somewhat on both sides   MEDICAL DECISION MAKING  A. No diagnosis found.  B. DATA ANALYSED:  IMAGING: Independent interpretation of images: NO  Orders: No Outside records reviewed: Records from Dr. Merlene Laughter indicate treatment for chronic pain as well as carpal tunnel syndrome he also has a nerve conduction study which I will read into the record.  The left and right median palmar responses show prolonged latencies additionally amplitudes are reduced the left and right ulnar palmar responses are normal the left median motor potential showed use prolonged latency the amplitudes were normal the right  median motor potential shows slightly reduced amplitudes the distal latency and conduction velocity normal the left and right ulnar motor potentials are normal needle EMG shows no degeneration    C. MANAGEMENT right carpal tunnel release  The procedure has been fully reviewed with the patient; The risks and benefits of surgery have been discussed and explained and understood. Alternative treatment has also been reviewed, questions were encouraged and answered. The postoperative plan is also been reviewed.  12:23 PM  Arther Abbott, MD  07/23/2019

## 2019-07-24 ENCOUNTER — Ambulatory Visit (HOSPITAL_COMMUNITY)
Admission: RE | Admit: 2019-07-24 | Discharge: 2019-07-24 | Disposition: A | Discharge status: Home / Self Care | Payer: Medicaid Other | Attending: Orthopedic Surgery | Admitting: Orthopedic Surgery

## 2019-07-24 ENCOUNTER — Ambulatory Visit (HOSPITAL_COMMUNITY): Payer: Medicaid Other | Admitting: Anesthesiology

## 2019-07-24 ENCOUNTER — Encounter (HOSPITAL_COMMUNITY)
Admission: RE | Disposition: A | Discharge status: Home / Self Care | Payer: Self-pay | Source: Home / Self Care | Attending: Orthopedic Surgery

## 2019-07-24 ENCOUNTER — Encounter (HOSPITAL_COMMUNITY): Payer: Self-pay | Admitting: Orthopedic Surgery

## 2019-07-24 DIAGNOSIS — M549 Dorsalgia, unspecified: Secondary | ICD-10-CM | POA: Insufficient documentation

## 2019-07-24 DIAGNOSIS — G8929 Other chronic pain: Secondary | ICD-10-CM | POA: Diagnosis not present

## 2019-07-24 DIAGNOSIS — F419 Anxiety disorder, unspecified: Secondary | ICD-10-CM | POA: Diagnosis not present

## 2019-07-24 DIAGNOSIS — F329 Major depressive disorder, single episode, unspecified: Secondary | ICD-10-CM | POA: Insufficient documentation

## 2019-07-24 DIAGNOSIS — I10 Essential (primary) hypertension: Secondary | ICD-10-CM | POA: Diagnosis not present

## 2019-07-24 DIAGNOSIS — F17200 Nicotine dependence, unspecified, uncomplicated: Secondary | ICD-10-CM | POA: Insufficient documentation

## 2019-07-24 DIAGNOSIS — Z9682 Presence of neurostimulator: Secondary | ICD-10-CM | POA: Insufficient documentation

## 2019-07-24 DIAGNOSIS — Z79899 Other long term (current) drug therapy: Secondary | ICD-10-CM | POA: Diagnosis not present

## 2019-07-24 DIAGNOSIS — G5601 Carpal tunnel syndrome, right upper limb: Secondary | ICD-10-CM

## 2019-07-24 HISTORY — PX: CARPAL TUNNEL RELEASE: SHX101

## 2019-07-24 LAB — BASIC METABOLIC PANEL
Anion gap: 11 (ref 5–15)
BUN: 18 mg/dL (ref 6–20)
CO2: 25 mmol/L (ref 22–32)
Calcium: 9.2 mg/dL (ref 8.9–10.3)
Chloride: 103 mmol/L (ref 98–111)
Creatinine, Ser: 0.95 mg/dL (ref 0.61–1.24)
GFR calc Af Amer: >60 mL/min (ref >60)
GFR calc non Af Amer: >60 mL/min (ref >60)
Glucose, Bld: 117 mg/dL — ABNORMAL HIGH (ref 70–99)
Potassium: 3.9 mmol/L (ref 3.5–5.1)
Sodium: 139 mmol/L (ref 135–145)

## 2019-07-24 SURGERY — CARPAL TUNNEL RELEASE
Anesthesia: General | Site: Hand | Laterality: Right

## 2019-07-24 MED ORDER — ONDANSETRON HCL 4 MG/2ML IJ SOLN
INTRAMUSCULAR | Status: DC | PRN
  Administered 2019-07-24: 4 mg via INTRAVENOUS

## 2019-07-24 MED ORDER — PROPOFOL 10 MG/ML IV BOLUS
INTRAVENOUS | Status: AC
  Filled 2019-07-24: qty 20

## 2019-07-24 MED ORDER — LIDOCAINE 2% (20 MG/ML) 5 ML SYRINGE
INTRAMUSCULAR | Status: AC
  Filled 2019-07-24: qty 5

## 2019-07-24 MED ORDER — KETAMINE HCL 10 MG/ML IJ SOLN
INTRAMUSCULAR | Status: DC | PRN
  Administered 2019-07-24: 20 mg via INTRAVENOUS

## 2019-07-24 MED ORDER — LACTATED RINGERS IV SOLN: INTRAVENOUS | Status: DC | PRN

## 2019-07-24 MED ORDER — CHLORHEXIDINE GLUCONATE 4 % EX LIQD: 60.0000 mL | Freq: Once | CUTANEOUS | Status: DC

## 2019-07-24 MED ORDER — MIDAZOLAM HCL 5 MG/5ML IJ SOLN
INTRAMUSCULAR | Status: DC | PRN
  Administered 2019-07-24: 2 mg via INTRAVENOUS

## 2019-07-24 MED ORDER — CEFAZOLIN SODIUM-DEXTROSE 2-4 GM/100ML-% IV SOLN
INTRAVENOUS | Status: AC
  Filled 2019-07-24: qty 100

## 2019-07-24 MED ORDER — MIDAZOLAM HCL 2 MG/2ML IJ SOLN
INTRAMUSCULAR | Status: AC
  Filled 2019-07-24: qty 2

## 2019-07-24 MED ORDER — LIDOCAINE HCL (PF) 1 % IJ SOLN
INTRAMUSCULAR | Status: AC
  Filled 2019-07-24: qty 30

## 2019-07-24 MED ORDER — FENTANYL CITRATE (PF) 100 MCG/2ML IJ SOLN
INTRAMUSCULAR | Status: AC
  Filled 2019-07-24: qty 2

## 2019-07-24 MED ORDER — KETAMINE HCL 50 MG/5ML IJ SOSY
PREFILLED_SYRINGE | INTRAMUSCULAR | Status: AC
  Filled 2019-07-24: qty 5

## 2019-07-24 MED ORDER — LIDOCAINE HCL (PF) 1 % IJ SOLN
INTRAMUSCULAR | Status: DC | PRN
  Administered 2019-07-24: 7 mL

## 2019-07-24 MED ORDER — FENTANYL CITRATE (PF) 100 MCG/2ML IJ SOLN
INTRAMUSCULAR | Status: DC | PRN
  Administered 2019-07-24: 100 ug via INTRAVENOUS

## 2019-07-24 MED ORDER — 0.9 % SODIUM CHLORIDE (POUR BTL) OPTIME
TOPICAL | Status: DC | PRN
  Administered 2019-07-24: 1000 mL

## 2019-07-24 MED ORDER — CEFAZOLIN SODIUM-DEXTROSE 2-4 GM/100ML-% IV SOLN
2.0000 g | INTRAVENOUS | Status: AC
  Administered 2019-07-24: 2 g via INTRAVENOUS

## 2019-07-24 MED ORDER — LIDOCAINE 2% (20 MG/ML) 5 ML SYRINGE
INTRAMUSCULAR | Status: DC | PRN
  Administered 2019-07-24: 30 mg via INTRAVENOUS

## 2019-07-24 MED ORDER — LIDOCAINE HCL (PF) 0.5 % IJ SOLN
INTRAMUSCULAR | Status: AC
  Filled 2019-07-24: qty 50

## 2019-07-24 MED ORDER — BUPIVACAINE HCL (PF) 0.5 % IJ SOLN
INTRAMUSCULAR | Status: AC
  Filled 2019-07-24: qty 30

## 2019-07-24 MED ORDER — PROPOFOL 10 MG/ML IV BOLUS
INTRAVENOUS | Status: DC | PRN
  Administered 2019-07-24 (×4): 20 mg via INTRAVENOUS
  Administered 2019-07-24: 10 mg via INTRAVENOUS
  Administered 2019-07-24: 20 mg via INTRAVENOUS

## 2019-07-24 SURGICAL SUPPLY — 40 items
BANDAGE ESMARK 4X12 BL STRL LF (DISPOSABLE) ×1 IMPLANT
BLADE SURG 15 STRL LF DISP TIS (BLADE) ×1 IMPLANT
BLADE SURG 15 STRL SS (BLADE) ×3
BNDG COHESIVE 4X5 TAN STRL (GAUZE/BANDAGES/DRESSINGS) ×3 IMPLANT
BNDG ELASTIC 3X5.8 VLCR NS LF (GAUZE/BANDAGES/DRESSINGS) ×3 IMPLANT
BNDG ESMARK 4X12 BLUE STRL LF (DISPOSABLE) ×3
BNDG GAUZE ELAST 4 BULKY (GAUZE/BANDAGES/DRESSINGS) ×3 IMPLANT
CHLORAPREP W/TINT 26 (MISCELLANEOUS) ×3 IMPLANT
CLOTH BEACON ORANGE TIMEOUT ST (SAFETY) ×3 IMPLANT
COVER LIGHT HANDLE STERIS (MISCELLANEOUS) ×6 IMPLANT
COVER WAND RF STERILE (DRAPES) ×3 IMPLANT
CUFF TOURN SGL QUICK 24 (TOURNIQUET CUFF) ×3
CUFF TRNQT CYL 24X4X16.5-23 (TOURNIQUET CUFF) IMPLANT
DECANTER SPIKE VIAL GLASS SM (MISCELLANEOUS) ×3 IMPLANT
DRAPE HALF SHEET 40X57 (DRAPES) ×3 IMPLANT
DRSG XEROFORM 1X8 (GAUZE/BANDAGES/DRESSINGS) ×3 IMPLANT
ELECT NDL TIP 2.8 STRL (NEEDLE) ×1 IMPLANT
ELECT NEEDLE TIP 2.8 STRL (NEEDLE) ×3 IMPLANT
ELECT REM PT RETURN 9FT ADLT (ELECTROSURGICAL) ×3
ELECTRODE REM PT RTRN 9FT ADLT (ELECTROSURGICAL) ×1 IMPLANT
GAUZE SPONGE 4X4 12PLY STRL (GAUZE/BANDAGES/DRESSINGS) ×3 IMPLANT
GLOVE BIOGEL PI IND STRL 7.0 (GLOVE) ×2 IMPLANT
GLOVE BIOGEL PI INDICATOR 7.0 (GLOVE) ×4
GLOVE ECLIPSE 6.5 STRL STRAW (GLOVE) ×4 IMPLANT
GLOVE SKINSENSE NS SZ8.0 LF (GLOVE) ×2
GLOVE SKINSENSE STRL SZ8.0 LF (GLOVE) ×1 IMPLANT
GLOVE SS N UNI LF 8.5 STRL (GLOVE) ×3 IMPLANT
GOWN STRL REUS W/TWL LRG LVL3 (GOWN DISPOSABLE) ×5 IMPLANT
GOWN STRL REUS W/TWL XL LVL3 (GOWN DISPOSABLE) ×3 IMPLANT
KIT TURNOVER KIT A (KITS) ×3 IMPLANT
MANIFOLD NEPTUNE II (INSTRUMENTS) ×3 IMPLANT
NDL HYPO 21X1.5 SAFETY (NEEDLE) ×1 IMPLANT
NEEDLE HYPO 21X1.5 SAFETY (NEEDLE) ×3 IMPLANT
NS IRRIG 1000ML POUR BTL (IV SOLUTION) ×3 IMPLANT
PACK BASIC LIMB (CUSTOM PROCEDURE TRAY) ×3 IMPLANT
PAD ARMBOARD 7.5X6 YLW CONV (MISCELLANEOUS) ×3 IMPLANT
POSITIONER HAND ALUMI XLG (MISCELLANEOUS) ×3 IMPLANT
SET BASIN LINEN APH (SET/KITS/TRAYS/PACK) ×3 IMPLANT
SUT ETHILON 3 0 FSL (SUTURE) ×3 IMPLANT
SYR CONTROL 10ML LL (SYRINGE) ×3 IMPLANT

## 2019-07-24 NOTE — Brief Op Note (Signed)
07/24/2019  10:30 AM  PATIENT:  Noah Vasquez  39 y.o. male  PRE-OPERATIVE DIAGNOSIS:  right carpal tunnel syndrome  POST-OPERATIVE DIAGNOSIS:  right carpal tunnel syndrome  PROCEDURE:  Procedure(s): CARPAL TUNNEL RELEASE (Right)  SURGEON:  Surgeon(s) and Role:    Vickki Hearing, MD - Primary  PHYSICIAN ASSISTANT:   ASSISTANTS: none   ANESTHESIA:   Propofol with 7 cc of 1% lidocaine local  EBL:  none   BLOOD ADMINISTERED:none  DRAINS: none   LOCAL MEDICATIONS USED:  NONE  SPECIMEN:  No Specimen  DISPOSITION OF SPECIMEN:  N/A  COUNTS:  YES  TOURNIQUET:   Total Tourniquet Time Documented: Upper Arm (Right) - 10 minutes Total: Upper Arm (Right) - 10 minutes   DICTATION: .Noah Vasquez Dictation  PLAN OF CARE: Discharge to home after PACU  PATIENT DISPOSITION:  PACU - hemodynamically stable.   Delay start of Pharmacological VTE agent (>24hrs) due to surgical blood loss or risk of bleeding: not applicable   Noah Vasquez was taken to the operating room after preop assessment and confirmation of side of his right wrist for surgery  He had general anesthesia with propofol and then after sterile prep and drape and timeout had 7 cc of Marcaine plain 1% injected into the incision site  The limb was exsanguinated with a 4 inch Esmarch tourniquet elevated to 250 mmHg  The incision was made in line with the radial border of the ring finger the subcutaneous tissue was divided and then sharp dissection was used to divide the transverse carpal ligament.  The wound was irrigated and closed with 3-0 nylon suture in running fashion  Operative findings included compression of the median nerve and mild discoloration  Sterile dressing was applied tourniquet was released color came back to the hand normally and he was taken to recovery room in stable condition

## 2019-07-24 NOTE — Anesthesia Postprocedure Evaluation (Signed)
Anesthesia Post Note  Patient: Noah Vasquez  Procedure(s) Performed: CARPAL TUNNEL RELEASE (Right Hand)  Patient location during evaluation: PACU Anesthesia Type: General Level of consciousness: awake and alert Pain management: pain level controlled Vital Signs Assessment: post-procedure vital signs reviewed and stable Respiratory status: spontaneous breathing, nonlabored ventilation, respiratory function stable and patient connected to nasal cannula oxygen Cardiovascular status: stable and blood pressure returned to baseline Postop Assessment: no apparent nausea or vomiting Anesthetic complications: no     Last Vitals:  Vitals:   07/24/19 0809  Pulse: 70  Resp: 18  Temp: 36.7 C    Last Pain:  Vitals:   07/24/19 0809  TempSrc: Oral  PainSc: 4                  Doaa Kendzierski

## 2019-07-24 NOTE — Anesthesia Preprocedure Evaluation (Signed)
Anesthesia Evaluation  Patient identified by MRN, date of birth, ID band Patient awake    Reviewed: Allergy & Precautions, H&P , NPO status , Patient's Chart, lab work & pertinent test results, reviewed documented beta blocker date and time   Airway Mallampati: II  TM Distance: >3 FB Neck ROM: full    Dental no notable dental hx.    Pulmonary Current Smoker,    Pulmonary exam normal        Cardiovascular hypertension, Normal cardiovascular exam     Neuro/Psych  Headaches, PSYCHIATRIC DISORDERS Anxiety Depression    GI/Hepatic negative GI ROS, Neg liver ROS,   Endo/Other  negative endocrine ROS  Renal/GU negative Renal ROS  negative genitourinary   Musculoskeletal   Abdominal   Peds  Hematology negative hematology ROS (+)   Anesthesia Other Findings   Reproductive/Obstetrics negative OB ROS                             Anesthesia Physical Anesthesia Plan  ASA: II  Anesthesia Plan: General   Post-op Pain Management:    Induction:   PONV Risk Score and Plan: Propofol infusion  Airway Management Planned:   Additional Equipment:   Intra-op Plan:   Post-operative Plan:   Informed Consent: I have reviewed the patients History and Physical, chart, labs and discussed the procedure including the risks, benefits and alternatives for the proposed anesthesia with the patient or authorized representative who has indicated his/her understanding and acceptance.       Plan Discussed with: Surgeon and CRNA  Anesthesia Plan Comments:         Anesthesia Quick Evaluation

## 2019-07-24 NOTE — Transfer of Care (Signed)
Immediate Anesthesia Transfer of Care Note  Patient: Noah Vasquez  Procedure(s) Performed: CARPAL TUNNEL RELEASE (Right Hand)  Patient Location: PACU  Anesthesia Type:MAC  Level of Consciousness: awake, alert , oriented and patient cooperative  Airway & Oxygen Therapy: Patient Spontanous Breathing and Patient connected to nasal cannula oxygen  Post-op Assessment: Report given to RN, Post -op Vital signs reviewed and stable and Patient moving all extremities X 4  Post vital signs: Reviewed and stable  Last Vitals:  Vitals Value Taken Time  BP    Temp    Pulse    Resp    SpO2      Last Pain:  Vitals:   07/24/19 0809  TempSrc: Oral  PainSc: 4       Patients Stated Pain Goal: 6 (89/38/10 1751)  Complications: No anesthesia complications

## 2019-07-24 NOTE — Interval H&P Note (Signed)
History and Physical Interval Note:  07/24/2019 9:51 AM  Noah Vasquez  has presented today for surgery, with the diagnosis of right carpal tunnel syndrome.  The various methods of treatment have been discussed with the patient and family. After consideration of risks, benefits and other options for treatment, the patient has consented to  Procedure(s): CARPAL TUNNEL RELEASE (Right) as a surgical intervention.  The patient's history has been reviewed, patient examined, no change in status, stable for surgery.  I have reviewed the patient's chart and labs.  Questions were answered to the patient's satisfaction.     Fuller Canada

## 2019-07-24 NOTE — Discharge Instructions (Signed)
   Monitored Anesthesia Care, Care After These instructions provide you with information about caring for yourself after your procedure. Your health care provider may also give you more specific instructions. Your treatment has been planned according to current medical practices, but problems sometimes occur. Call your health care provider if you have any problems or questions after your procedure. What can I expect after the procedure? After your procedure, you may:  Feel sleepy for several hours.  Feel clumsy and have poor balance for several hours.  Feel forgetful about what happened after the procedure.  Have poor judgment for several hours.  Feel nauseous or vomit.  Have a sore throat if you had a breathing tube during the procedure. Follow these instructions at home: For at least 24 hours after the procedure:      Have a responsible adult stay with you. It is important to have someone help care for you until you are awake and alert.  Rest as needed.  Do not: ? Participate in activities in which you could fall or become injured. ? Drive. ? Use heavy machinery. ? Drink alcohol. ? Take sleeping pills or medicines that cause drowsiness. ? Make important decisions or sign legal documents. ? Take care of children on your own. Eating and drinking  Follow the diet that is recommended by your health care provider.  If you vomit, drink water, juice, or soup when you can drink without vomiting.  Make sure you have little or no nausea before eating solid foods. General instructions  Take over-the-counter and prescription medicines only as told by your health care provider.  If you have sleep apnea, surgery and certain medicines can increase your risk for breathing problems. Follow instructions from your health care provider about wearing your sleep device: ? Anytime you are sleeping, including during daytime naps. ? While taking prescription pain medicines, sleeping  medicines, or medicines that make you drowsy.  If you smoke, do not smoke without supervision.  Keep all follow-up visits as told by your health care provider. This is important. Contact a health care provider if:  You keep feeling nauseous or you keep vomiting.  You feel light-headed.  You develop a rash.  You have a fever. Get help right away if:  You have trouble breathing. Summary  For several hours after your procedure, you may feel sleepy and have poor judgment.  Have a responsible adult stay with you for at least 24 hours or until you are awake and alert. This information is not intended to replace advice given to you by your health care provider. Make sure you discuss any questions you have with your health care provider. Document Revised: 07/16/2017 Document Reviewed: 08/08/2015 Elsevier Patient Education  2020 [employer]  

## 2019-07-24 NOTE — Op Note (Signed)
07/24/2019  10:30 AM  PATIENT:  Noah Vasquez  39 y.o. male  PRE-OPERATIVE DIAGNOSIS:  right carpal tunnel syndrome  POST-OPERATIVE DIAGNOSIS:  right carpal tunnel syndrome  PROCEDURE:  Procedure(s): CARPAL TUNNEL RELEASE (Right) - 64721  SURGEON:  Surgeon(s) and Role:    Vickki Hearing, MD - Primary  PHYSICIAN ASSISTANT:   ASSISTANTS: none   ANESTHESIA:   Propofol with 7 cc of 1% lidocaine local  EBL:  none   BLOOD ADMINISTERED:none  DRAINS: none   LOCAL MEDICATIONS USED:  NONE  SPECIMEN:  No Specimen  DISPOSITION OF SPECIMEN:  N/A  COUNTS:  YES  TOURNIQUET:   Total Tourniquet Time Documented: Upper Arm (Right) - 10 minutes Total: Upper Arm (Right) - 10 minutes   DICTATION: .Reubin Milan Dictation  PLAN OF CARE: Discharge to home after PACU  PATIENT DISPOSITION:  PACU - hemodynamically stable.   Delay start of Pharmacological VTE agent (>24hrs) due to surgical blood loss or risk of bleeding: not applicable   Noah Vasquez was taken to the operating room after preop assessment and confirmation of side of his right wrist for surgery  He had general anesthesia with propofol and then after sterile prep and drape and timeout had 7 cc of Marcaine plain 1% injected into the incision site  The limb was exsanguinated with a 4 inch Esmarch tourniquet elevated to 250 mmHg  The incision was made in line with the radial border of the ring finger the subcutaneous tissue was divided and then sharp dissection was used to divide the transverse carpal ligament.  The wound was irrigated and closed with 3-0 nylon suture in running fashion  Operative findings included compression of the median nerve and mild discoloration  Sterile dressing was applied tourniquet was released color came back to the hand normally and he was taken to recovery room in stable condition

## 2019-07-29 ENCOUNTER — Encounter (HOSPITAL_COMMUNITY): Payer: Medicaid Other

## 2019-07-29 ENCOUNTER — Other Ambulatory Visit (HOSPITAL_COMMUNITY): Payer: Medicaid Other

## 2019-08-04 ENCOUNTER — Telehealth: Payer: Self-pay | Admitting: Orthopedic Surgery

## 2019-08-04 NOTE — Telephone Encounter (Signed)
Voice message received from patient asking if he may shower, or if needs to wait till 08/08/19 - [phone]

## 2019-08-04 NOTE — Telephone Encounter (Signed)
I advised him not to get his hand wet until he gets the stitches out.

## 2019-08-08 ENCOUNTER — Other Ambulatory Visit: Payer: Self-pay

## 2019-08-08 ENCOUNTER — Encounter: Payer: Self-pay | Admitting: Orthopedic Surgery

## 2019-08-08 ENCOUNTER — Ambulatory Visit (INDEPENDENT_AMBULATORY_CARE_PROVIDER_SITE_OTHER): Payer: Medicaid Other | Admitting: Orthopedic Surgery

## 2019-08-08 VITALS — Temp 97.2°F | Ht 69.0 in | Wt 233.0 lb

## 2019-08-08 DIAGNOSIS — Z9889 Other specified postprocedural states: Secondary | ICD-10-CM

## 2019-08-08 DIAGNOSIS — Z4889 Encounter for other specified surgical aftercare: Secondary | ICD-10-CM

## 2019-08-08 DIAGNOSIS — G5602 Carpal tunnel syndrome, left upper limb: Secondary | ICD-10-CM

## 2019-08-08 DIAGNOSIS — G5601 Carpal tunnel syndrome, right upper limb: Secondary | ICD-10-CM

## 2019-08-08 NOTE — Patient Instructions (Addendum)
Noah Vasquez your hand looks good your wound looks good you can go ahead and crochet you can use soap and water on the wound can take a shower  Keep your hand out of standing water for another week  We will schedule your left carpal tunnel on the week of the 20th  We will call you for follow-up in that with time and date  Smoking Tobacco Information, Adult Smoking tobacco can be harmful to your health. Tobacco contains a poisonous (toxic), colorless chemical called nicotine. Nicotine is addictive. It changes the brain and can make it hard to stop smoking. Tobacco also has other toxic chemicals that can hurt your body and raise your risk of many cancers. How can smoking tobacco affect me? Smoking tobacco puts you at risk for:  Cancer. Smoking is most commonly associated with lung cancer, but can also lead to cancer in other parts of the body.  Chronic obstructive pulmonary disease (COPD). This is a long-term lung condition that makes it hard to breathe. It also gets worse over time.  High blood pressure (hypertension), heart disease, stroke, or heart attack.  Lung infections, such as pneumonia.  Cataracts. This is when the lenses in the eyes become clouded.  Digestive problems. This may include peptic ulcers, heartburn, and gastroesophageal reflux disease (GERD).  Oral health problems, such as gum disease and tooth loss.  Loss of taste and smell. Smoking can affect your appearance by causing:  Wrinkles.  Yellow or stained teeth, fingers, and fingernails. Smoking tobacco can also affect your social life, because:  It may be challenging to find places to smoke when away from home. Many workplaces, [employer], hotels, and public places are tobacco-free.  Smoking is expensive. This is due to the cost of tobacco and the long-term costs of treating health problems from smoking.  Secondhand smoke may affect those around you. Secondhand smoke can cause lung cancer, breathing problems, and  heart disease. Children of smokers have a higher risk for: ? Sudden infant death syndrome (SIDS). ? Ear infections. ? Lung infections. If you currently smoke tobacco, quitting now can help you:  Lead a longer and healthier life.  Look, smell, breathe, and feel better over time.  Save money.  Protect others from the harms of secondhand smoke. What actions can I take to prevent health problems? Quit smoking   Do not start smoking. Quit if you already do.  Make a plan to quit smoking and commit to it. Look for programs to help you and ask your health care provider for recommendations and ideas.  Set a date and write down all the reasons you want to quit.  Let your friends and family know you are quitting so they can help and support you. Consider finding friends who also want to quit. It can be easier to quit with someone else, so that you can support each other.  Talk with your health care provider about using nicotine replacement medicines to help you quit, such as gum, lozenges, patches, sprays, or pills.  Do not replace cigarette smoking with electronic cigarettes, which are commonly called e-cigarettes. The safety of e-cigarettes is not known, and some may contain harmful chemicals.  If you try to quit but return to smoking, stay positive. It is common to slip up when you first quit, so take it one day at a time.  Be prepared for cravings. When you feel the urge to smoke, chew gum or suck on hard candy. Lifestyle  Stay busy and take care  of your body.  Drink enough fluid to keep your urine pale yellow.  Get plenty of exercise and eat a healthy diet. This can help prevent weight gain after quitting.  Monitor your eating habits. Quitting smoking can cause you to have a larger appetite than when you smoke.  Find ways to relax. Go out with friends or family to a movie or a restaurant where people do not smoke.  Ask your health care provider about having regular tests  (screenings) to check for cancer. This may include blood tests, imaging tests, and other tests.  Find ways to manage your stress, such as meditation, yoga, or exercise. Where to find support To get support to quit smoking, consider:  Asking your health care provider for more information and resources.  Taking classes to learn more about quitting smoking.  Looking for local organizations that offer resources about quitting smoking.  Joining a support group for people who want to quit smoking in your local community.  Calling the smokefree.gov counselor helpline: 1-800-Quit-Now (701) 325-2431) Where to find more information You may find more information about quitting smoking from:  HelpGuide.org: www.helpguide.org  https://hall.com/: smokefree.gov  American Lung Association: www.lung.org Contact a health care provider if you:  Have problems breathing.  Notice that your lips, nose, or fingers turn blue.  Have chest pain.  Are coughing up blood.  Feel faint or you pass out.  Have other health changes that cause you to worry. Summary  Smoking tobacco can negatively affect your health, the health of those around you, your finances, and your social life.  Do not start smoking. Quit if you already do. If you need help quitting, ask your health care provider.  Think about joining a support group for people who want to quit smoking in your local community. There are many effective programs that will help you to quit this behavior. This information is not intended to replace advice given to you by your health care provider. Make sure you discuss any questions you have with your health care provider. Document Revised: 01/10/2019 Document Reviewed: 05/02/2016 Elsevier Patient Education  2020 [employer].

## 2019-08-08 NOTE — Progress Notes (Signed)
Noah Vasquez had a right carpal tunnel release  He is here for his first postop visit  He would like his left carpal tunnel release  His right hand incision was inspected his sutures were removed he had good wound closure can start crocheting he can use soap and water on the wound he should keep it out of standing water for another week  He would like surgery week of the 20th  Diagnosis #1 status post carpal tunnel release right  Diagnosis #2 left carpal tunnel syndrome  Encounter Diagnoses  Name Primary?  Marland Kitchen Aftercare following surgery Yes  . Carpal tunnel syndrome, left upper limb   . Carpal tunnel syndrome of right wrist   . History of carpal tunnel surgery of right wrist July 24, 2019

## 2019-08-12 ENCOUNTER — Other Ambulatory Visit: Payer: Self-pay | Admitting: Orthopedic Surgery

## 2019-08-12 ENCOUNTER — Telehealth: Payer: Self-pay | Admitting: Radiology

## 2019-08-12 NOTE — Telephone Encounter (Signed)
-----   Message from Vickki Hearing, MD sent at 08/08/2019  2:39 PM EDT ----- Schedule carpal tunnel release left wrist for week of April 20

## 2019-08-12 NOTE — Telephone Encounter (Signed)
April 22n d spoke to patient put in orders

## 2019-08-19 ENCOUNTER — Other Ambulatory Visit: Payer: Self-pay

## 2019-08-19 ENCOUNTER — Encounter (HOSPITAL_COMMUNITY)
Admission: RE | Admit: 2019-08-19 | Discharge: 2019-08-19 | Disposition: A | Discharge status: Home / Self Care | Payer: Medicaid Other | Source: Ambulatory Visit | Attending: Orthopedic Surgery | Admitting: Orthopedic Surgery

## 2019-08-19 ENCOUNTER — Other Ambulatory Visit (HOSPITAL_COMMUNITY)
Admission: RE | Admit: 2019-08-19 | Discharge: 2019-08-19 | Disposition: A | Discharge status: Home / Self Care | Payer: Medicaid Other | Source: Ambulatory Visit | Attending: Orthopedic Surgery | Admitting: Orthopedic Surgery

## 2019-08-19 DIAGNOSIS — Z01812 Encounter for preprocedural laboratory examination: Secondary | ICD-10-CM | POA: Insufficient documentation

## 2019-08-19 DIAGNOSIS — Z20822 Contact with and (suspected) exposure to covid-19: Secondary | ICD-10-CM | POA: Diagnosis not present

## 2019-08-19 NOTE — Patient Instructions (Signed)
Noah Vasquez  08/19/2019     @PREFPERIOPPHARMACY @   Your procedure is scheduled on  08/21/2019   Report to [name] at  [place]  Call this number if you have problems the morning of surgery:  [phone]   Remember:  Do not eat or drink after midnight.                      Take these medicines the morning of surgery with A SIP OF WATER  Carvedilol, flexeril(if needed), dexilant,Prozac, oxycodone(if needed).    Do not wear jewelry, make-up or nail polish.  Do not wear lotions, powders, or perfumes. Please brush your teeth and wear deodorant.  Do not shave 48 hours prior to surgery.  Men may shave face and neck.  Do not bring valuables to the hospital.  [facility] is not responsible for any belongings or valuables.  Contacts, dentures or bridgework may not be worn into surgery.  Leave your suitcase in the car.  After surgery it may be brought to your room.  For patients admitted to the hospital, discharge time will be determined by your treatment team.  Patients discharged the day of surgery will not be allowed to drive home.   Name and phone number of your driver:   family Special instructions:  DO NOT smoke the morning of your procedure.  Please read over the following fact sheets that you were given. Anesthesia Post-op Instructions and Care and Recovery After Surgery       Open Carpal Tunnel Release, Care After This sheet gives you information about how to care for yourself after your procedure. Your health care provider may also give you more specific instructions. If you have problems or questions, contact your health care provider. What can I expect after the procedure? After the procedure, it is common to have:  Wrist stiffness.  Bruising. Follow these instructions at home: Bathing  Do not take baths, swim, or use a hot tub until your health care provider approves. Ask your health care provider if you may take showers.  Keep your bandage  (dressing) dry until your health care provider says it can be removed. If you have a splint or brace:  Wear the splint or brace as told by your health care provider. You may need to wear it for 2-3 weeks. Remove it only as told by your health care provider.  Loosen the splint or brace if your fingers tingle, become numb, or turn cold and blue.  Keep the splint or brace clean.  If the splint or brace is not waterproof: ? Do not let it get wet. ? Cover it with a watertight covering when you take a bath or a shower. Incision care   Follow instructions from your health care provider about how to take care of your incision. Make sure you: ? Wash your hands with soap and water before you change your dressing. If soap and water are not available, use hand sanitizer. ? Change your dressing as told by your health care provider. ? Leave stitches (sutures), skin glue, or adhesive strips in place. These skin closures may need to stay in place for 2 weeks or longer. If adhesive strip edges start to loosen and curl up, you may trim the loose edges. Do not remove adhesive strips completely unless your health care provider tells you to do that.  Check your incision area every day for signs of  infection. Check for: ? Redness, swelling, or pain. ? Fluid or blood. ? Warmth. ? Pus or a bad smell. Managing pain, stiffness, and swelling   If directed, put ice on the affected area. ? If you have a removable splint or brace, remove it as told by your health care provider. ? Put ice in a plastic bag. ? Place a towel between your skin and the bag. ? Leave the ice on for 20 minutes, 2-3 times a day.  Move your fingers often to avoid stiffness and to lessen swelling.  Raise (elevate) your wrist above the level of your heart while you are sitting or lying down. Activity  Do not drive until your health care provider approves.  Do not drive or use heavy machinery while taking prescription pain  medicine.  Return to your normal activities as told by your health care provider. Avoid activities that cause pain.  If physical therapy was prescribed, do exercises as told by your therapist. Physical therapy can help you heal faster and regain movement. General instructions  Take over-the-counter and prescription medicines only as told by your health care provider.  If you are taking prescription pain medicine, take actions to prevent or treat constipation. Your health care provider may recommend that you: ? Drink enough fluid to keep your urine pale yellow. ? Eat foods that are high in fiber, such as fresh fruits and vegetables, whole grains, and beans. ? Limit foods that are high in fat and processed sugars, such as fried or sweet foods. ? Take an over-the-counter or prescription medicine for constipation.  Do not use any products that contain nicotine or tobacco, such as cigarettes and e-cigarettes. If you need help quitting, ask your health care provider.  Keep all follow-up visits as told by your health care provider and physical therapist. This is important. Contact a health care provider if:  You have redness or swelling around your incision.  You have fluid or blood coming from your incision.  Your incision feels warm to the touch.  You have pus or a bad smell coming from your incision.  You have a fever.  You have chills.  You have pain that does not get better with medicine.  Your carpal tunnel symptoms do not go away after 2 months.  Your carpal tunnel symptoms go away and then come back. Get help right away if:  You have pain or numbness that is getting worse.  Your fingers or fingertips become very pale or bluish in color.  You are not able to move your fingers. Summary  It is common to have wrist stiffness and bruising after a carpal tunnel release.  Icing and raising (elevating) your wrist may help to lessen swelling and pain.  Call your health care  provider if you have a fever or notice any signs of infection in your incision area. This information is not intended to replace advice given to you by your health care provider. Make sure you discuss any questions you have with your health care provider. Document Revised: 03/30/2017 Document Reviewed: 12/25/2016 Elsevier Patient Education  2020 [employer]  Monitored Anesthesia Care, Care After These instructions provide you with information about caring for yourself after your procedure. Your health care provider may also give you more specific instructions. Your treatment has been planned according to current medical practices, but problems sometimes occur. Call your health care provider if you have any problems or questions after your procedure. What can I expect after the procedure? After  your procedure, you may:  Feel sleepy for several hours.  Feel clumsy and have poor balance for several hours.  Feel forgetful about what happened after the procedure.  Have poor judgment for several hours.  Feel nauseous or vomit.  Have a sore throat if you had a breathing tube during the procedure. Follow these instructions at home: For at least 24 hours after the procedure:      Have a responsible adult stay with you. It is important to have someone help care for you until you are awake and alert.  Rest as needed.  Do not: ? Participate in activities in which you could fall or become injured. ? Drive. ? Use heavy machinery. ? Drink alcohol. ? Take sleeping pills or medicines that cause drowsiness. ? Make important decisions or sign legal documents. ? Take care of children on your own. Eating and drinking  Follow the diet that is recommended by your health care provider.  If you vomit, drink water, juice, or soup when you can drink without vomiting.  Make sure you have little or no nausea before eating solid foods. General instructions  Take over-the-counter and prescription  medicines only as told by your health care provider.  If you have sleep apnea, surgery and certain medicines can increase your risk for breathing problems. Follow instructions from your health care provider about wearing your sleep device: ? Anytime you are sleeping, including during daytime naps. ? While taking prescription pain medicines, sleeping medicines, or medicines that make you drowsy.  If you smoke, do not smoke without supervision.  Keep all follow-up visits as told by your health care provider. This is important. Contact a health care provider if:  You keep feeling nauseous or you keep vomiting.  You feel light-headed.  You develop a rash.  You have a fever. Get help right away if:  You have trouble breathing. Summary  For several hours after your procedure, you may feel sleepy and have poor judgment.  Have a responsible adult stay with you for at least 24 hours or until you are awake and alert. This information is not intended to replace advice given to you by your health care provider. Make sure you discuss any questions you have with your health care provider. Document Revised: 07/16/2017 Document Reviewed: 08/08/2015 Elsevier Patient Education  2020 [employer]. How to Use Chlorhexidine for Bathing Chlorhexidine gluconate (CHG) is a germ-killing (antiseptic) solution that is used to clean the skin. It can get rid of the bacteria that normally live on the skin and can keep them away for about 24 hours. To clean your skin with CHG, you may be given:  A CHG solution to use in the shower or as part of a sponge bath.  A prepackaged cloth that contains CHG. Cleaning your skin with CHG may help lower the risk for infection:  While you are staying in the intensive care unit of the hospital.  If you have a vascular access, such as a central line, to provide short-term or long-term access to your veins.  If you have a catheter to drain urine from your bladder.  If  you are on a ventilator. A ventilator is a machine that helps you breathe by moving air in and out of your lungs.  After surgery. What are the risks? Risks of using CHG include:  A skin reaction.  Hearing loss, if CHG gets in your ears.  Eye injury, if CHG gets in your eyes and is not rinsed  out.  The CHG product catching fire. Make sure that you avoid smoking and flames after applying CHG to your skin. Do not use CHG:  If you have a chlorhexidine allergy or have previously reacted to chlorhexidine.  On babies younger than 35 months of age. How to use CHG solution  Use CHG only as told by your health care provider, and follow the instructions on the label.  Use the full amount of CHG as directed. Usually, this is one bottle. During a shower Follow these steps when using CHG solution during a shower (unless your health care provider gives you different instructions): 1. Start the shower. 2. Use your normal soap and shampoo to wash your face and hair. 3. Turn off the shower or move out of the shower stream. 4. Pour the CHG onto a clean washcloth. Do not use any type of brush or rough-edged sponge. 5. Starting at your neck, lather your body down to your toes. Make sure you follow these instructions: ? If you will be having surgery, pay special attention to the part of your body where you will be having surgery. Scrub this area for at least 1 minute. ? Do not use CHG on your head or face. If the solution gets into your ears or eyes, rinse them well with water. ? Avoid your genital area. ? Avoid any areas of skin that have broken skin, cuts, or scrapes. ? Scrub your back and under your arms. Make sure to wash skin folds. 6. Let the lather sit on your skin for 1-2 minutes or as long as told by your health care provider. 7. Thoroughly rinse your entire body in the shower. Make sure that all body creases and crevices are rinsed well. 8. Dry off with a clean towel. Do not put any  substances on your body afterward--such as powder, lotion, or perfume--unless you are told to do so by your health care provider. Only use lotions that are recommended by the manufacturer. 9. Put on clean clothes or pajamas. 10. If it is the night before your surgery, sleep in clean sheets.  During a sponge bath Follow these steps when using CHG solution during a sponge bath (unless your health care provider gives you different instructions): 1. Use your normal soap and shampoo to wash your face and hair. 2. Pour the CHG onto a clean washcloth. 3. Starting at your neck, lather your body down to your toes. Make sure you follow these instructions: ? If you will be having surgery, pay special attention to the part of your body where you will be having surgery. Scrub this area for at least 1 minute. ? Do not use CHG on your head or face. If the solution gets into your ears or eyes, rinse them well with water. ? Avoid your genital area. ? Avoid any areas of skin that have broken skin, cuts, or scrapes. ? Scrub your back and under your arms. Make sure to wash skin folds. 4. Let the lather sit on your skin for 1-2 minutes or as long as told by your health care provider. 5. Using a different clean, wet washcloth, thoroughly rinse your entire body. Make sure that all body creases and crevices are rinsed well. 6. Dry off with a clean towel. Do not put any substances on your body afterward--such as powder, lotion, or perfume--unless you are told to do so by your health care provider. Only use lotions that are recommended by the manufacturer. 7. Put on clean clothes  or pajamas. 8. If it is the night before your surgery, sleep in clean sheets. How to use CHG prepackaged cloths  Only use CHG cloths as told by your health care provider, and follow the instructions on the label.  Use the CHG cloth on clean, dry skin.  Do not use the CHG cloth on your head or face unless your health care provider tells you  to.  When washing with the CHG cloth: ? Avoid your genital area. ? Avoid any areas of skin that have broken skin, cuts, or scrapes. Before surgery Follow these steps when using a CHG cloth to clean before surgery (unless your health care provider gives you different instructions): 1. Using the CHG cloth, vigorously scrub the part of your body where you will be having surgery. Scrub using a back-and-forth motion for 3 minutes. The area on your body should be completely wet with CHG when you are done scrubbing. 2. Do not rinse. Discard the cloth and let the area air-dry. Do not put any substances on the area afterward, such as powder, lotion, or perfume. 3. Put on clean clothes or pajamas. 4. If it is the night before your surgery, sleep in clean sheets.  For general bathing Follow these steps when using CHG cloths for general bathing (unless your health care provider gives you different instructions). 1. Use a separate CHG cloth for each area of your body. Make sure you wash between any folds of skin and between your fingers and toes. Wash your body in the following order, switching to a new cloth after each step: ? The front of your neck, shoulders, and chest. ? Both of your arms, under your arms, and your hands. ? Your stomach and groin area, avoiding the genitals. ? Your right leg and foot. ? Your left leg and foot. ? The back of your neck, your back, and your buttocks. 2. Do not rinse. Discard the cloth and let the area air-dry. Do not put any substances on your body afterward--such as powder, lotion, or perfume--unless you are told to do so by your health care provider. Only use lotions that are recommended by the manufacturer. 3. Put on clean clothes or pajamas. Contact a health care provider if:  Your skin gets irritated after scrubbing.  You have questions about using your solution or cloth. Get help right away if:  Your eyes become very red or swollen.  Your eyes itch  badly.  Your skin itches badly and is red or swollen.  Your hearing changes.  You have trouble seeing.  You have swelling or tingling in your mouth or throat.  You have trouble breathing.  You swallow any chlorhexidine. Summary  Chlorhexidine gluconate (CHG) is a germ-killing (antiseptic) solution that is used to clean the skin. Cleaning your skin with CHG may help to lower your risk for infection.  You may be given CHG to use for bathing. It may be in a bottle or in a prepackaged cloth to use on your skin. Carefully follow your health care provider's instructions and the instructions on the product label.  Do not use CHG if you have a chlorhexidine allergy.  Contact your health care provider if your skin gets irritated after scrubbing. This information is not intended to replace advice given to you by your health care provider. Make sure you discuss any questions you have with your health care provider. Document Revised: 07/04/2018 Document Reviewed: 03/15/2017 Elsevier Patient Education  2020 [employer].

## 2019-08-20 ENCOUNTER — Encounter (HOSPITAL_COMMUNITY): Payer: Self-pay

## 2019-08-20 ENCOUNTER — Other Ambulatory Visit: Payer: Self-pay

## 2019-08-20 LAB — SARS CORONAVIRUS 2 (TAT 6-24 HRS): SARS Coronavirus 2: NEGATIVE

## 2019-08-20 NOTE — H&P (Signed)
Noah Vasquez is an 39 y.o. male.   Chief Complaint: Pain paresthesias left hand HPI: 39 year old male had successful right carpal tunnel release for right carpal tunnel syndrome has pain and paresthesias with weakness in his left upper extremity was now like his left carpal tunnel released  Past Medical History:  Diagnosis Date  . Anxiety   . Bronchitis   . Chronic back pain   . Depression   . Headache(784.0)   . Hypertension     Past Surgical History:  Procedure Laterality Date  . BACK SURGERY    . CARPAL TUNNEL RELEASE Right 07/24/2019   Procedure: CARPAL TUNNEL RELEASE;  Surgeon: Vickki Hearing, MD;  Location: [facility];  Service: Orthopedics;  Laterality: Right;  . HERNIA REPAIR    . SACRAL NERVE STIMULATOR PLACEMENT Bilateral 2014    Family History  Problem Relation Age of Onset  . Diabetes Mother   . Heart disease Mother   . High blood pressure Mother   . Heart disease Father    Social History:  reports that he has been smoking cigarettes. He has a 16.00 pack-year smoking history. He has never used smokeless tobacco. He reports that he does not drink alcohol or use drugs.  Allergies:  Allergies  Allergen Reactions  . Wellbutrin [Bupropion] Other (See Comments)    Makes pt mean  . Bee Venom Swelling    No medications prior to admission.    Results for orders placed or performed during the hospital encounter of 08/19/19 (from the past 48 hour(s))  SARS CORONAVIRUS 2 (TAT 6-24 HRS) Nasopharyngeal Nasopharyngeal Swab     Status: None   Collection Time: 08/19/19  7:19 AM   Specimen: Nasopharyngeal Swab  Result Value Ref Range   SARS Coronavirus 2 NEGATIVE NEGATIVE    Comment: (NOTE) SARS-CoV-2 target nucleic acids are NOT DETECTED. The SARS-CoV-2 RNA is generally detectable in upper and lower respiratory specimens during the acute phase of infection. Negative results do not preclude SARS-CoV-2 infection, do not rule out co-infections with other pathogens, and  should not be used as the sole basis for treatment or other patient management decisions. Negative results must be combined with clinical observations, patient history, and epidemiological information. The expected result is Negative. Fact Sheet for Patients: HairSlick.no Fact Sheet for Healthcare Providers: quierodirigir.com This test is not yet approved or cleared by the [place] FDA and  has been authorized for detection and/or diagnosis of SARS-CoV-2 by FDA under an Emergency Use Authorization (EUA). This EUA will remain  in effect (meaning this test can be used) for the duration of the COVID-19 declaration under Section 56 4(b)(1) of the Act, 21 U.S.C. section 360bbb-3(b)(1), unless the authorization is terminated or revoked sooner. Performed at [facility], [address], [place], [place] [address]    No results found.  Review of Systems Weakness and numbness and tingling related to his carpal tunnel There were no vitals taken for this visit. Physical Exam  Awake alert and oriented x3 mood affect normal General appearance no deformities  Left upper extremity tenderness over the carpal tunnel decreased sensation in the fingers normal vascularity good distal radial ulnar pulse normal range of motion wrist joint stable mild grip strength weakness Assessment/Plan Left carpal tunnel syndrome  Open left carpal tunnel release  Fuller Canada, MD 08/20/2019, 2:27 PM

## 2019-08-21 ENCOUNTER — Encounter (HOSPITAL_COMMUNITY): Payer: Self-pay | Admitting: Orthopedic Surgery

## 2019-08-21 ENCOUNTER — Ambulatory Visit (HOSPITAL_COMMUNITY): Payer: Medicaid Other | Admitting: Anesthesiology

## 2019-08-21 ENCOUNTER — Ambulatory Visit (HOSPITAL_COMMUNITY)
Admission: RE | Admit: 2019-08-21 | Discharge: 2019-08-21 | Disposition: A | Discharge status: Home / Self Care | Payer: Medicaid Other | Attending: Orthopedic Surgery | Admitting: Orthopedic Surgery

## 2019-08-21 ENCOUNTER — Encounter (HOSPITAL_COMMUNITY)
Admission: RE | Disposition: A | Discharge status: Home / Self Care | Payer: Self-pay | Source: Home / Self Care | Attending: Orthopedic Surgery

## 2019-08-21 DIAGNOSIS — I10 Essential (primary) hypertension: Secondary | ICD-10-CM | POA: Diagnosis not present

## 2019-08-21 DIAGNOSIS — G5602 Carpal tunnel syndrome, left upper limb: Secondary | ICD-10-CM | POA: Insufficient documentation

## 2019-08-21 DIAGNOSIS — F17200 Nicotine dependence, unspecified, uncomplicated: Secondary | ICD-10-CM | POA: Diagnosis not present

## 2019-08-21 DIAGNOSIS — F17210 Nicotine dependence, cigarettes, uncomplicated: Secondary | ICD-10-CM | POA: Insufficient documentation

## 2019-08-21 HISTORY — PX: CARPAL TUNNEL RELEASE: SHX101

## 2019-08-21 SURGERY — CARPAL TUNNEL RELEASE
Anesthesia: General | Site: Hand | Laterality: Left

## 2019-08-21 MED ORDER — FENTANYL CITRATE (PF) 100 MCG/2ML IJ SOLN
INTRAMUSCULAR | Status: AC
  Filled 2019-08-21: qty 2

## 2019-08-21 MED ORDER — 0.9 % SODIUM CHLORIDE (POUR BTL) OPTIME
TOPICAL | Status: DC | PRN
  Administered 2019-08-21: 1000 mL

## 2019-08-21 MED ORDER — ONDANSETRON HCL 4 MG/2ML IJ SOLN
INTRAMUSCULAR | Status: AC
  Filled 2019-08-21: qty 2

## 2019-08-21 MED ORDER — CEFAZOLIN SODIUM-DEXTROSE 2-4 GM/100ML-% IV SOLN
INTRAVENOUS | Status: AC
  Filled 2019-08-21: qty 100

## 2019-08-21 MED ORDER — FENTANYL CITRATE (PF) 100 MCG/2ML IJ SOLN
INTRAMUSCULAR | Status: DC | PRN
  Administered 2019-08-21 (×2): 50 ug via INTRAVENOUS

## 2019-08-21 MED ORDER — CEFAZOLIN SODIUM-DEXTROSE 2-4 GM/100ML-% IV SOLN: 2.0000 g | INTRAVENOUS | Status: DC

## 2019-08-21 MED ORDER — MIDAZOLAM HCL 2 MG/2ML IJ SOLN
INTRAMUSCULAR | Status: AC
  Filled 2019-08-21: qty 2

## 2019-08-21 MED ORDER — BUPIVACAINE HCL (PF) 0.5 % IJ SOLN
INTRAMUSCULAR | Status: AC
  Filled 2019-08-21: qty 30

## 2019-08-21 MED ORDER — LACTATED RINGERS IV SOLN: INTRAVENOUS | Status: DC

## 2019-08-21 MED ORDER — BUPIVACAINE HCL (PF) 0.5 % IJ SOLN
INTRAMUSCULAR | Status: DC | PRN
  Administered 2019-08-21: 20 mL

## 2019-08-21 MED ORDER — FENTANYL CITRATE (PF) 100 MCG/2ML IJ SOLN
25.0000 ug | INTRAMUSCULAR | Status: DC | PRN
  Administered 2019-08-21: 50 ug via INTRAVENOUS
  Filled 2019-08-21: qty 2

## 2019-08-21 MED ORDER — ONDANSETRON HCL 4 MG/2ML IJ SOLN
INTRAMUSCULAR | Status: DC | PRN
  Administered 2019-08-21: 4 mg via INTRAVENOUS

## 2019-08-21 MED ORDER — PROPOFOL 500 MG/50ML IV EMUL
INTRAVENOUS | Status: DC | PRN
  Administered 2019-08-21: 100 ug/kg/min via INTRAVENOUS

## 2019-08-21 MED ORDER — CEFAZOLIN SODIUM-DEXTROSE 2-3 GM-%(50ML) IV SOLR
INTRAVENOUS | Status: DC | PRN
  Administered 2019-08-21: 2 g via INTRAVENOUS

## 2019-08-21 MED ORDER — LIDOCAINE HCL (CARDIAC) PF 100 MG/5ML IV SOSY
PREFILLED_SYRINGE | INTRAVENOUS | Status: DC | PRN
  Administered 2019-08-21: 80 mg via INTRAVENOUS

## 2019-08-21 MED ORDER — PROPOFOL 10 MG/ML IV BOLUS
INTRAVENOUS | Status: DC | PRN
  Administered 2019-08-21: 40 mg via INTRAVENOUS

## 2019-08-21 MED ORDER — LIDOCAINE HCL (PF) 1 % IJ SOLN
INTRAMUSCULAR | Status: AC
  Filled 2019-08-21: qty 30

## 2019-08-21 MED ORDER — MIDAZOLAM HCL 2 MG/2ML IJ SOLN
INTRAMUSCULAR | Status: DC | PRN
  Administered 2019-08-21: 2 mg via INTRAVENOUS

## 2019-08-21 MED ORDER — PROPOFOL 10 MG/ML IV BOLUS
INTRAVENOUS | Status: AC
  Filled 2019-08-21: qty 80

## 2019-08-21 SURGICAL SUPPLY — 37 items
BANDAGE ESMARK 4X12 BL STRL LF (DISPOSABLE) ×1 IMPLANT
BLADE SURG 15 STRL LF DISP TIS (BLADE) ×1 IMPLANT
BLADE SURG 15 STRL SS (BLADE) ×3
BNDG COHESIVE 4X5 TAN STRL (GAUZE/BANDAGES/DRESSINGS) ×3 IMPLANT
BNDG ELASTIC 3X5.8 VLCR NS LF (GAUZE/BANDAGES/DRESSINGS) ×3 IMPLANT
BNDG ESMARK 4X12 BLUE STRL LF (DISPOSABLE) ×3
BNDG GAUZE ELAST 4 BULKY (GAUZE/BANDAGES/DRESSINGS) ×3 IMPLANT
CHLORAPREP W/TINT 26 (MISCELLANEOUS) ×3 IMPLANT
CLOTH BEACON ORANGE TIMEOUT ST (SAFETY) ×3 IMPLANT
COVER LIGHT HANDLE STERIS (MISCELLANEOUS) ×6 IMPLANT
COVER WAND RF STERILE (DRAPES) ×3 IMPLANT
CUFF TOURN SGL QUICK 18X4 (TOURNIQUET CUFF) ×3 IMPLANT
DECANTER SPIKE VIAL GLASS SM (MISCELLANEOUS) ×3 IMPLANT
DRAPE HALF SHEET 40X57 (DRAPES) ×3 IMPLANT
DRSG XEROFORM 1X8 (GAUZE/BANDAGES/DRESSINGS) ×3 IMPLANT
ELECT NEEDLE TIP 2.8 STRL (NEEDLE) ×3 IMPLANT
ELECT REM PT RETURN 9FT ADLT (ELECTROSURGICAL) ×3
ELECTRODE REM PT RTRN 9FT ADLT (ELECTROSURGICAL) ×1 IMPLANT
GAUZE SPONGE 4X4 12PLY STRL (GAUZE/BANDAGES/DRESSINGS) ×3 IMPLANT
GLOVE BIO SURGEON STRL SZ7 (GLOVE) ×6 IMPLANT
GLOVE BIOGEL PI IND STRL 7.0 (GLOVE) ×2 IMPLANT
GLOVE BIOGEL PI INDICATOR 7.0 (GLOVE) ×4
GLOVE SKINSENSE NS SZ8.0 LF (GLOVE) ×2
GLOVE SKINSENSE STRL SZ8.0 LF (GLOVE) ×1 IMPLANT
GLOVE SS N UNI LF 8.5 STRL (GLOVE) ×3 IMPLANT
GOWN STRL REUS W/TWL LRG LVL3 (GOWN DISPOSABLE) ×6 IMPLANT
GOWN STRL REUS W/TWL XL LVL3 (GOWN DISPOSABLE) ×3 IMPLANT
KIT TURNOVER KIT A (KITS) ×3 IMPLANT
MANIFOLD NEPTUNE II (INSTRUMENTS) ×3 IMPLANT
NEEDLE HYPO 21X1.5 SAFETY (NEEDLE) ×3 IMPLANT
NS IRRIG 1000ML POUR BTL (IV SOLUTION) ×3 IMPLANT
PACK BASIC LIMB (CUSTOM PROCEDURE TRAY) ×3 IMPLANT
PAD ARMBOARD 7.5X6 YLW CONV (MISCELLANEOUS) ×3 IMPLANT
POSITIONER HAND ALUMI XLG (MISCELLANEOUS) ×3 IMPLANT
SET BASIN LINEN APH (SET/KITS/TRAYS/PACK) ×3 IMPLANT
SUT ETHILON 3 0 FSL (SUTURE) ×3 IMPLANT
SYR CONTROL 10ML LL (SYRINGE) ×3 IMPLANT

## 2019-08-21 NOTE — Op Note (Addendum)
08/21/2019  8:19 AM  PATIENT:  Noah Vasquez  39 y.o. male  PRE-OPERATIVE DIAGNOSIS:  left carpal tunnel syndrome  POST-OPERATIVE DIAGNOSIS:  left carpal tunnel syndrome  PROCEDURE:  Procedure(s): CARPAL TUNNEL RELEASE (Left) -[id number]  SURGEON:  Surgeon(s) and Role:    Vickki Hearing, MD - Primary  PHYSICIAN ASSISTANT:   ASSISTANTS: none   ANESTHESIA:   local and IV sedation  EBL:  10 mL   BLOOD ADMINISTERED:none  DRAINS: none   LOCAL MEDICATIONS USED:  MARCAINE     SPECIMEN:  No Specimen  DISPOSITION OF SPECIMEN:  N/A  COUNTS:  YES  TOURNIQUET:   Total Tourniquet Time Documented: Upper Arm (Left) - 15 minutes Total: Upper Arm (Left) - 15 minutes   DICTATION: .Reubin Milan Dictation  PLAN OF CARE: Discharge to home after PACU  PATIENT DISPOSITION:  PACU - hemodynamically stable.   Delay start of Pharmacological VTE agent (>24hrs) due to surgical blood loss or risk of bleeding: not applicable  Details of procedure  The patient was seen in preop the left hand was marked as the surgical site confirmed with chart update and review  Patient taken to surgery for IV sedation.  He was in the supine position he was given IV propofol and after sterile prep and drape and timeout, 10 cc of half percent bupivacaine was injected into the surgical site  Limb was then exsanguinated with a 4 inch Esmarch and elevated the tourniquet to 250 mmHg.  The incision was made in line with the radial border of the ring finger starting at the distal aspect of the transverse carpal ligament and taken proximally.  Subcutaneous tissue was divided until the palmar fascia was identified.  Blunt dissection was carried out and a blunt instrument was passed beneath the palmar fascia.  The wrist was then extended and the transverse carpal ligament was divided.  A deep retractor was placed and a blunt scissors was used to continue the division of the transverse carpal ligament until it was  completely divided.  The wound was irrigated the subcutaneous tissues were injected with 10 cc of half percent bupivacaine and the wound was closed with interrupted and running nylon 3-0 suture  Sterile dressing was applied tourniquet was released color came back well to the hand with good capillary refill  Patient taken recovery room in stable condition

## 2019-08-21 NOTE — Discharge Instructions (Signed)
General Anesthesia, Adult, Care After This sheet gives you information about how to care for yourself after your procedure. Your health care provider may also give you more specific instructions. If you have problems or questions, contact your health care provider. What can I expect after the procedure? After the procedure, the following side effects are common:  Pain or discomfort at the IV site.  Nausea.  Vomiting.  Sore throat.  Trouble concentrating.  Feeling cold or chills.  Weak or tired.  Sleepiness and fatigue.  Soreness and body aches. These side effects can affect parts of the body that were not involved in surgery. Follow these instructions at home:  For at least 24 hours after the procedure:  Have a responsible adult stay with you. It is important to have someone help care for you until you are awake and alert.  Rest as needed.  Do not: ? Participate in activities in which you could fall or become injured. ? Drive. ? Use heavy machinery. ? Drink alcohol. ? Take sleeping pills or medicines that cause drowsiness. ? Make important decisions or sign legal documents. ? Take care of children on your own. Eating and drinking  Follow any instructions from your health care provider about eating or drinking restrictions.  When you feel hungry, start by eating small amounts of foods that are soft and easy to digest (bland), such as toast. Gradually return to your regular diet.  Drink enough fluid to keep your urine pale yellow.  If you vomit, rehydrate by drinking water, juice, or clear broth. General instructions  If you have sleep apnea, surgery and certain medicines can increase your risk for breathing problems. Follow instructions from your health care provider about wearing your sleep device: ? Anytime you are sleeping, including during daytime naps. ? While taking prescription pain medicines, sleeping medicines, or medicines that make you drowsy.  Return to  your normal activities as told by your health care provider. Ask your health care provider what activities are safe for you.  Take over-the-counter and prescription medicines only as told by your health care provider.  If you smoke, do not smoke without supervision.  Keep all follow-up visits as told by your health care provider. This is important. Contact a health care provider if:  You have nausea or vomiting that does not get better with medicine.  You cannot eat or drink without vomiting.  You have pain that does not get better with medicine.  You are unable to pass urine.  You develop a skin rash.  You have a fever.  You have redness around your IV site that gets worse. Get help right away if:  You have difficulty breathing.  You have chest pain.  You have blood in your urine or stool, or you vomit blood. Summary  After the procedure, it is common to have a sore throat or nausea. It is also common to feel tired.  Have a responsible adult stay with you for the first 24 hours after general anesthesia. It is important to have someone help care for you until you are awake and alert.  When you feel hungry, start by eating small amounts of foods that are soft and easy to digest (bland), such as toast. Gradually return to your regular diet.  Drink enough fluid to keep your urine pale yellow.  Return to your normal activities as told by your health care provider. Ask your health care provider what activities are safe for you. This information is not   intended to replace advice given to you by your health care provider. Make sure you discuss any questions you have with your health care provider. Document Revised: 04/20/2017 Document Reviewed: 12/01/2016 Elsevier Patient Education  2020 [employer]  

## 2019-08-21 NOTE — Transfer of Care (Signed)
Immediate Anesthesia Transfer of Care Note  Patient: Noah Vasquez  Procedure(s) Performed: CARPAL TUNNEL RELEASE (Left Hand)  Patient Location: PACU  Anesthesia Type:MAC and General  Level of Consciousness: awake, alert , oriented and patient cooperative  Airway & Oxygen Therapy: Patient Spontanous Breathing  Post-op Assessment: Report given to RN and Post -op Vital signs reviewed and stable  Post vital signs: Reviewed and stable  Last Vitals:  Vitals Value Taken Time  BP    Temp    Pulse    Resp    SpO2      Last Pain:  Vitals:   08/21/19 0654  TempSrc: Oral  PainSc: 5       Patients Stated Pain Goal: 5 (56/25/63 8937)  Complications: No apparent anesthesia complications

## 2019-08-21 NOTE — Anesthesia Preprocedure Evaluation (Signed)
Anesthesia Evaluation  Patient identified by MRN, date of birth, ID band Patient awake    Reviewed: Allergy & Precautions, H&P , NPO status , Patient's Chart, lab work & pertinent test results, reviewed documented beta blocker date and time   Airway Mallampati: II  TM Distance: >3 FB Neck ROM: full    Dental no notable dental hx. (+) Teeth Intact   Pulmonary neg pulmonary ROS, Current Smoker and Patient abstained from smoking.,    Pulmonary exam normal breath sounds clear to auscultation       Cardiovascular Exercise Tolerance: Good hypertension, negative cardio ROS   Rhythm:regular Rate:Normal     Neuro/Psych  Headaches, PSYCHIATRIC DISORDERS Anxiety Depression  Neuromuscular disease    GI/Hepatic negative GI ROS, Neg liver ROS,   Endo/Other  negative endocrine ROS  Renal/GU negative Renal ROS  negative genitourinary   Musculoskeletal   Abdominal   Peds  Hematology negative hematology ROS (+)   Anesthesia Other Findings   Reproductive/Obstetrics negative OB ROS                             Anesthesia Physical Anesthesia Plan  ASA: II  Anesthesia Plan: General   Post-op Pain Management:    Induction:   PONV Risk Score and Plan: 2 and Propofol infusion  Airway Management Planned:   Additional Equipment:   Intra-op Plan:   Post-operative Plan:   Informed Consent: I have reviewed the patients History and Physical, chart, labs and discussed the procedure including the risks, benefits and alternatives for the proposed anesthesia with the patient or authorized representative who has indicated his/her understanding and acceptance.     Dental Advisory Given  Plan Discussed with: CRNA  Anesthesia Plan Comments:         Anesthesia Quick Evaluation

## 2019-08-21 NOTE — Anesthesia Postprocedure Evaluation (Signed)
Anesthesia Post Note  Patient: Noah Vasquez  Procedure(s) Performed: CARPAL TUNNEL RELEASE (Left Hand)  Patient location during evaluation: PACU Anesthesia Type: General and MAC Level of consciousness: awake and alert Pain management: pain level controlled Vital Signs Assessment: post-procedure vital signs reviewed and stable Respiratory status: spontaneous breathing Cardiovascular status: blood pressure returned to baseline Postop Assessment: no apparent nausea or vomiting Anesthetic complications: no     Last Vitals:  Vitals:   08/21/19 0654  BP: 131/84  Pulse: 82  Resp: 18  Temp: 36.7 C  SpO2: 97%    Last Pain:  Vitals:   08/21/19 0654  TempSrc: Oral  PainSc: 5                  Everette Rank

## 2019-08-21 NOTE — Brief Op Note (Signed)
08/21/2019  8:19 AM  PATIENT:  Noah Vasquez  39 y.o. male  PRE-OPERATIVE DIAGNOSIS:  left carpal tunnel syndrome  POST-OPERATIVE DIAGNOSIS:  left carpal tunnel syndrome  PROCEDURE:  Procedure(s): CARPAL TUNNEL RELEASE (Left)  SURGEON:  Surgeon(s) and Role:    Vickki Hearing, MD - Primary  PHYSICIAN ASSISTANT:   ASSISTANTS: none   ANESTHESIA:   local and IV sedation  EBL:  10 mL   BLOOD ADMINISTERED:none  DRAINS: none   LOCAL MEDICATIONS USED:  MARCAINE     SPECIMEN:  No Specimen  DISPOSITION OF SPECIMEN:  N/A  COUNTS:  YES  TOURNIQUET:   Total Tourniquet Time Documented: Upper Arm (Left) - 15 minutes Total: Upper Arm (Left) - 15 minutes   DICTATION: .Reubin Milan Dictation  PLAN OF CARE: Discharge to home after PACU  PATIENT DISPOSITION:  PACU - hemodynamically stable.   Delay start of Pharmacological VTE agent (>24hrs) due to surgical blood loss or risk of bleeding: not applicable  Details of procedure  The patient was seen in preop the left hand was marked as the surgical site confirmed with chart update and review  Patient taken to surgery for IV sedation.  He was in the supine position he was given IV propofol and after sterile prep and drape and timeout, 10 cc of half percent bupivacaine was injected into the surgical site  Limb was then exsanguinated with a 4 inch Esmarch and elevated the tourniquet to 250 mmHg.  The incision was made in line with the radial border of the ring finger starting at the distal aspect of the transverse carpal ligament and taken proximally.  Subcutaneous tissue was divided until the palmar fascia was identified.  Blunt dissection was carried out and a blunt instrument was passed beneath the palmar fascia.  The wrist was then extended and the transverse carpal ligament was divided.  A deep retractor was placed and a blunt scissors was used to continue the division of the transverse carpal ligament until it was completely  divided.  The wound was irrigated the subcutaneous tissues were injected with 10 cc of half percent bupivacaine and the wound was closed with interrupted and running nylon 3-0 suture  Sterile dressing was applied tourniquet was released color came back well to the hand with good capillary refill  Patient taken recovery room in stable condition

## 2019-08-21 NOTE — Interval H&P Note (Signed)
History and Physical Interval Note:  08/21/2019 7:24 AM  Noah Vasquez  has presented today for surgery, with the diagnosis of left carpal tunnel syndrome.  The various methods of treatment have been discussed with the patient and family. After consideration of risks, benefits and other options for treatment, the patient has consented to  Procedure(s): CARPAL TUNNEL RELEASE (Left) as a surgical intervention.  The patient's history has been reviewed, patient examined, no change in status, stable for surgery.  I have reviewed the patient's chart and labs.  Questions were answered to the patient's satisfaction.     Fuller Canada

## 2019-08-21 NOTE — Brief Op Note (Signed)
08/21/2019  8:14 AM  PATIENT:  Noah Vasquez  39 y.o. male  PRE-OPERATIVE DIAGNOSIS:  left carpal tunnel syndrome  POST-OPERATIVE DIAGNOSIS:  left carpal tunnel syndrome  PROCEDURE:  Procedure(s): CARPAL TUNNEL RELEASE (Left)  SURGEON:  Surgeon(s) and Role:    Vickki Hearing, MD - Primary  PHYSICIAN ASSISTANT:   ASSISTANTS: none   ANESTHESIA:   local and IV sedation  EBL:  10 mL   BLOOD ADMINISTERED:none  DRAINS: none   LOCAL MEDICATIONS USED:  MARCAINE     SPECIMEN:  No Specimen  DISPOSITION OF SPECIMEN:  N/A  COUNTS:  YES  TOURNIQUET:   Total Tourniquet Time Documented: Upper Arm (Left) - 15 minutes Total: Upper Arm (Left) - 15 minutes   DICTATION: .Reubin Milan Dictation  PLAN OF CARE: Discharge to home after PACU  PATIENT DISPOSITION:  PACU - hemodynamically stable.   Delay start of Pharmacological VTE agent (>24hrs) due to surgical blood loss or risk of bleeding: not applicable  Details of procedure  The patient was seen in preop the left hand was marked as the surgical site confirmed with chart update and review  Patient taken to surgery for IV sedation.  He was in the supine position he was given IV propofol and after sterile prep and drape and timeout, 10 cc of half percent bupivacaine was injected into the surgical site  Limb was then exsanguinated with a 4 inch Esmarch and elevated the tourniquet to 250 mmHg.  The incision was made in line with the radial border of the ring finger starting at the distal aspect of the transverse carpal ligament and taken proximally.  Subcutaneous tissue was divided until the palmar fascia was identified.  Blunt dissection was carried out and a blunt instrument was passed beneath the palmar fascia.  The wrist was then extended and the transverse carpal ligament was divided.  A deep retractor was placed and a blunt scissors was used to continue the division of the transverse carpal ligament until it was completely  divided.  The wound was irrigated the subcutaneous tissues were injected with 10 cc of half percent bupivacaine and the wound was closed with interrupted and running nylon 3-0 suture  Sterile dressing was applied tourniquet was released color came back well to the hand with good capillary refill  Patient taken recovery room in stable condition

## 2019-09-05 ENCOUNTER — Ambulatory Visit (INDEPENDENT_AMBULATORY_CARE_PROVIDER_SITE_OTHER): Payer: Medicaid Other | Admitting: Orthopedic Surgery

## 2019-09-05 ENCOUNTER — Encounter: Payer: Self-pay | Admitting: Orthopedic Surgery

## 2019-09-05 ENCOUNTER — Other Ambulatory Visit: Payer: Self-pay

## 2019-09-05 DIAGNOSIS — Z9889 Other specified postprocedural states: Secondary | ICD-10-CM

## 2019-09-05 NOTE — Patient Instructions (Signed)
Work on strengthening the hand

## 2019-09-05 NOTE — Progress Notes (Signed)
Chief Complaint  Patient presents with  . Routine Post Op    left CTR 08/21/19   Post op day 15 the patient is actually had to carpal tunnel releases he had the right hand done and in the left hand his sutures were removed his hand feels better wound is clean he can work on strengthening exercises see [place] as needed Encounter Diagnosis  Name Primary?  . S/P carpal tunnel release left 08/21/19 Yes

## 2019-10-31 ENCOUNTER — Other Ambulatory Visit: Payer: Self-pay

## 2019-10-31 ENCOUNTER — Encounter: Payer: Self-pay | Admitting: Emergency Medicine

## 2019-10-31 ENCOUNTER — Ambulatory Visit
Admission: EM | Admit: 2019-10-31 | Discharge: 2019-10-31 | Disposition: A | Discharge status: Home / Self Care | Payer: Medicaid Other | Attending: Emergency Medicine | Admitting: Emergency Medicine

## 2019-10-31 DIAGNOSIS — A6920 Lyme disease, unspecified: Secondary | ICD-10-CM

## 2019-10-31 DIAGNOSIS — W57XXXA Bitten or stung by nonvenomous insect and other nonvenomous arthropods, initial encounter: Secondary | ICD-10-CM | POA: Diagnosis not present

## 2019-10-31 DIAGNOSIS — S80869A Insect bite (nonvenomous), unspecified lower leg, initial encounter: Secondary | ICD-10-CM | POA: Diagnosis not present

## 2019-10-31 MED ORDER — DOXYCYCLINE HYCLATE 100 MG PO CAPS: 100.0000 mg | ORAL_CAPSULE | Freq: Two times a day (BID) | ORAL | 0 refills | Status: AC

## 2019-10-31 NOTE — ED Provider Notes (Signed)
[facility]   [id number] 10/31/19 Arrival Time: 1605   Chief Complaint  Patient presents with  . Insect Bite     SUBJECTIVE: History from: patient.  Noah Vasquez is a 39 y.o. male who presented to the urgent care with a complaint of tick bite to bilateral lower extremities for the past 1 week.  Patient is reporting vesicular rash with central clearing.  Noticed while changing clothes.  Localizes the bite to bilateral lower extremities.  Has remove the tick.  Currently patient is asymptomatic.  Denies previous hx of tick bite.  Denies fever, chills, nausea, vomiting, headache, dizziness, weakness, fatigue,  or abdominal pain.   ROS: As per HPI.  All other pertinent ROS negative.     Past Medical History:  Diagnosis Date  . Anxiety   . Bronchitis   . Chronic back pain   . Depression   . Headache(784.0)   . Hypertension    Past Surgical History:  Procedure Laterality Date  . BACK SURGERY    . CARPAL TUNNEL RELEASE Right 07/24/2019   Procedure: CARPAL TUNNEL RELEASE;  Surgeon: Vickki Hearing, MD;  Location: [facility];  Service: Orthopedics;  Laterality: Right;  . CARPAL TUNNEL RELEASE Left 08/21/2019   Procedure: CARPAL TUNNEL RELEASE;  Surgeon: Vickki Hearing, MD;  Location: [facility];  Service: Orthopedics;  Laterality: Left;  . HERNIA REPAIR    . SACRAL NERVE STIMULATOR PLACEMENT Bilateral 2014   Allergies  Allergen Reactions  . Wellbutrin [Bupropion] Other (See Comments)    Makes pt mean  . Bee Venom Swelling   No current facility-administered medications on file prior to encounter.   Current Outpatient Medications on File Prior to Encounter  Medication Sig Dispense Refill  . albuterol (PROVENTIL HFA;VENTOLIN HFA) 108 (90 BASE) MCG/ACT inhaler Inhale 2 puffs into the lungs every 6 (six) hours as needed for wheezing. 1 Inhaler 5  . carvedilol (COREG) 6.25 MG tablet Take 6.25 mg by mouth 2 (two) times daily with a meal.    . cyclobenzaprine (FLEXERIL) 10  MG tablet Take 10 mg by mouth 3 (three) times daily as needed for muscle spasms.    Marland Kitchen dexlansoprazole (DEXILANT) 60 MG capsule Take 60 mg by mouth at bedtime.    Marland Kitchen FLUoxetine (PROZAC) 20 MG capsule Take 20 mg by mouth daily.    Marland Kitchen GRALISE 600 MG TABS Take 1,800 mg by mouth at bedtime.     . montelukast (SINGULAIR) 10 MG tablet Take 10 mg by mouth at bedtime.    . naproxen (NAPROSYN) 500 MG tablet Take 500 mg by mouth 2 (two) times daily with a meal.    . oxyCODONE-acetaminophen (PERCOCET) 7.5-325 MG tablet Take 1 tablet by mouth every 8 (eight) hours as needed for severe pain.     Social History   Socioeconomic History  . Marital status: Married    Spouse name: Not on file  . Number of children: Not on file  . Years of education: Not on file  . Highest education level: Not on file  Occupational History  . Not on file  Tobacco Use  . Smoking status: Current Every Day Smoker    Packs/day: 1.00    Years: 16.00    Pack years: 16.00    Types: Cigarettes  . Smokeless tobacco: Never Used  Substance and Sexual Activity  . Alcohol use: No    Alcohol/week: 0.0 standard drinks  . Drug use: No  . Sexual activity: Not on file  Other  Topics Concern  . Not on file  Social History Narrative  . Not on file   Social Determinants of Health   Financial Resource Strain:   . Difficulty of Paying Living Expenses:   Food Insecurity:   . Worried About Programme researcher, broadcasting/film/video in the Last Year:   . Barista in the Last Year:   Transportation Needs:   . Freight forwarder (Medical):   Marland Kitchen Lack of Transportation (Non-Medical):   Physical Activity:   . Days of Exercise per Week:   . Minutes of Exercise per Session:   Stress:   . Feeling of Stress :   Social Connections:   . Frequency of Communication with Friends and Family:   . Frequency of Social Gatherings with Friends and Family:   . Attends Religious Services:   . Active Member of Clubs or Organizations:   . Attends Tax inspector Meetings:   Marland Kitchen Marital Status:   Intimate Partner Violence:   . Fear of Current or Ex-Partner:   . Emotionally Abused:   Marland Kitchen Physically Abused:   . Sexually Abused:    Family History  Problem Relation Age of Onset  . Diabetes Mother   . Heart disease Mother   . High blood pressure Mother   . Heart disease Father     OBJECTIVE:  Vitals:   10/31/19 1636 10/31/19 1637  BP: 130/77   Pulse: 88   Resp: 17   Temp: 98.4 F (36.9 C)   TempSrc: Oral   SpO2: 96%   Weight:  230 lb (104.3 kg)  Height:  5\' 9"  (1.753 m)     Physical Exam Vitals reviewed.  Constitutional:      General: He is not in acute distress.    Appearance: Normal appearance. He is normal weight. He is not ill-appearing, toxic-appearing or diaphoretic.  Cardiovascular:     Rate and Rhythm: Normal rate and regular rhythm.     Pulses: Normal pulses.     Heart sounds: Normal heart sounds. No murmur heard.  No friction rub. No gallop.   Pulmonary:     Effort: Pulmonary effort is normal. No respiratory distress.     Breath sounds: Normal breath sounds. No stridor. No wheezing, rhonchi or rales.  Chest:     Chest wall: No tenderness.  Skin:    General: Skin is warm.     Findings: Erythema and rash present. Rash is macular.     Comments: Circular rash with central clearing  Neurological:     Mental Status: He is alert.    LABS:  No results found for this or any previous visit (from the past 24 hour(s)).   ASSESSMENT & PLAN:  1. Erythema migrans (Lyme disease)   2. Tick bite of lower leg, unspecified laterality, initial encounter     Meds ordered this encounter  Medications  . doxycycline (VIBRAMYCIN) 100 MG capsule    Sig: Take 1 capsule (100 mg total) by mouth 2 (two) times daily for 14 days.    Dispense:  28 capsule    Refill:  0     Patient is stable at discharge.  Rash is likely from erythema migrans rash from Lyme disease.  Doxycycline 100 mg twice a day for 14 days represcribed.   Lyme disease Western blot and [facility] fever lab work completed.  Discharge Instructions  Prescribed doxycycline 100 mg for 14 days To prevent tick bites, wear long sleeves, long pants, and light colors.  Use insect repellent. Follow the instructions on the bottle. If the tick is biting, do not try to remove it with heat, alcohol, petroleum jelly, or fingernail polish. Use tweezers, curved forceps, or a tick-removal tool to grasp the tick. Gently pull up until the tick lets go. Do not twist or jerk the tick. Do not squeeze or crush the tick. Return here or go to ER if you have any new or worsening symptoms (rash, nausea, vomiting, fever, chills, headache, fatigue)    Reviewed expectations re: course of current medical issues. Questions answered. Outlined signs and symptoms indicating need for more acute intervention. Patient verbalized understanding. After Visit Summary given.      Note: This document was prepared using Dragon voice recognition software and may include unintentional dictation errors.    Durward Parcel, FNP 10/31/19 1754

## 2019-10-31 NOTE — Discharge Instructions (Addendum)
Prescribed doxycycline 100 mg for 14 days To prevent tick bites, wear long sleeves, long pants, and light colors. Use insect repellent. Follow the instructions on the bottle. If the tick is biting, do not try to remove it with heat, alcohol, petroleum jelly, or fingernail polish. Use tweezers, curved forceps, or a tick-removal tool to grasp the tick. Gently pull up until the tick lets go. Do not twist or jerk the tick. Do not squeeze or crush the tick. Return here or go to ER if you have any new or worsening symptoms (rash, nausea, vomiting, fever, chills, headache, fatigue)

## 2019-10-31 NOTE — ED Triage Notes (Signed)
Tick bite to RT and LT calf x 1 week ago.  Areas has reddened circles around them x 3days ago.

## 2019-11-07 LAB — LYME DISEASE, WESTERN BLOT
IgG P18 Ab.: ABSENT
IgG P23 Ab.: ABSENT
IgG P28 Ab.: ABSENT
IgG P30 Ab.: ABSENT
IgG P39 Ab.: ABSENT
IgG P41 Ab.: ABSENT
IgG P45 Ab.: ABSENT
IgG P58 Ab.: ABSENT
IgG P66 Ab.: ABSENT
IgG P93 Ab.: ABSENT
IgM P23 Ab.: ABSENT
IgM P39 Ab.: ABSENT
IgM P41 Ab.: ABSENT
Lyme IgG Wb: NEGATIVE
Lyme IgM Wb: NEGATIVE

## 2019-11-07 LAB — ROCKY MTN SPOTTED FVR ABS PNL(IGG+IGM)
RMSF IgG: NEGATIVE
RMSF IgM: 0.25 index (ref 0.00–0.89)

## 2020-05-02 ENCOUNTER — Emergency Department (HOSPITAL_COMMUNITY)
Admission: EM | Admit: 2020-05-02 | Discharge: 2020-05-02 | Disposition: A | Discharge status: Home / Self Care | Payer: Medicaid Other | Attending: Emergency Medicine | Admitting: Emergency Medicine

## 2020-05-02 ENCOUNTER — Other Ambulatory Visit: Payer: Self-pay

## 2020-05-02 ENCOUNTER — Emergency Department (HOSPITAL_COMMUNITY): Payer: Medicaid Other

## 2020-05-02 ENCOUNTER — Encounter (HOSPITAL_COMMUNITY): Payer: Self-pay | Admitting: Emergency Medicine

## 2020-05-02 DIAGNOSIS — K420 Umbilical hernia with obstruction, without gangrene: Secondary | ICD-10-CM | POA: Diagnosis not present

## 2020-05-02 DIAGNOSIS — R1033 Periumbilical pain: Secondary | ICD-10-CM

## 2020-05-02 DIAGNOSIS — I10 Essential (primary) hypertension: Secondary | ICD-10-CM | POA: Insufficient documentation

## 2020-05-02 DIAGNOSIS — F17210 Nicotine dependence, cigarettes, uncomplicated: Secondary | ICD-10-CM | POA: Diagnosis not present

## 2020-05-02 DIAGNOSIS — K429 Umbilical hernia without obstruction or gangrene: Secondary | ICD-10-CM

## 2020-05-02 DIAGNOSIS — K219 Gastro-esophageal reflux disease without esophagitis: Secondary | ICD-10-CM | POA: Insufficient documentation

## 2020-05-02 LAB — COMPREHENSIVE METABOLIC PANEL
ALT: 30 U/L (ref 0–44)
AST: 19 U/L (ref 15–41)
Albumin: 4.4 g/dL (ref 3.5–5.0)
Alkaline Phosphatase: 73 U/L (ref 38–126)
Anion gap: 6 (ref 5–15)
BUN: 17 mg/dL (ref 6–20)
CO2: 26 mmol/L (ref 22–32)
Calcium: 9 mg/dL (ref 8.9–10.3)
Chloride: 100 mmol/L (ref 98–111)
Creatinine, Ser: 0.8 mg/dL (ref 0.61–1.24)
GFR, Estimated: >60 mL/min (ref >60)
Glucose, Bld: 101 mg/dL — ABNORMAL HIGH (ref 70–99)
Potassium: 4 mmol/L (ref 3.5–5.1)
Sodium: 132 mmol/L — ABNORMAL LOW (ref 135–145)
Total Bilirubin: 0.7 mg/dL (ref 0.3–1.2)
Total Protein: 7.3 g/dL (ref 6.5–8.1)

## 2020-05-02 LAB — CBC
HCT: 49.5 % (ref 39.0–52.0)
Hemoglobin: 16.5 g/dL (ref 13.0–17.0)
MCH: 29 pg (ref 26.0–34.0)
MCHC: 33.3 g/dL (ref 30.0–36.0)
MCV: 87 fL (ref 80.0–100.0)
Platelets: 292 10*3/uL (ref 150–400)
RBC: 5.69 MIL/uL (ref 4.22–5.81)
RDW: 12.9 % (ref 11.5–15.5)
WBC: 9.5 10*3/uL (ref 4.0–10.5)
nRBC: 0 % (ref 0.0–0.2)

## 2020-05-02 LAB — URINALYSIS, ROUTINE W REFLEX MICROSCOPIC
Bilirubin Urine: NEGATIVE
Glucose, UA: NEGATIVE mg/dL
Hgb urine dipstick: NEGATIVE
Ketones, ur: NEGATIVE mg/dL
Leukocytes,Ua: NEGATIVE
Nitrite: NEGATIVE
Protein, ur: NEGATIVE mg/dL
Specific Gravity, Urine: 1.012 (ref 1.005–1.030)
pH: 7 (ref 5.0–8.0)

## 2020-05-02 LAB — LIPASE, BLOOD: Lipase: 40 U/L (ref 11–51)

## 2020-05-02 MED ORDER — IOHEXOL 300 MG/ML  SOLN
100.0000 mL | Freq: Once | INTRAMUSCULAR | Status: AC | PRN
  Administered 2020-05-02: 100 mL via INTRAVENOUS

## 2020-05-02 NOTE — ED Notes (Signed)
Patient transported to CT 

## 2020-05-02 NOTE — Discharge Instructions (Signed)
Thank you for letting us take care of you in the ER.  Your work-up today was overall reassuring.  Your CAT scan did not show any emergent cause of your abdominal pain.  I suspect that some of this may be due to constipation and gas.  Please incorporate MiraLAX into your diet, you may also use Gas-X as needed.  Please follow-up with your primary care doctor if your symptoms not improve.  I will also provide a referral to the general surgeon who can evaluate you for your umbilical hernia.  Please call the number to schedule an appointment.  Return to the ER for new or worsening symptoms.

## 2020-05-02 NOTE — ED Notes (Signed)
Pt up and in restroom at this time, aware and in agreement of a urine sample at this time.

## 2020-05-02 NOTE — ED Notes (Addendum)

## 2020-05-02 NOTE — ED Triage Notes (Signed)
Pt c/o of abdominal pain and swelling for a "few months"

## 2020-05-02 NOTE — ED Provider Notes (Signed)
[facility] EMERGENCY DEPARTMENT Provider Note   CSN: [id number] Arrival date & time: 05/02/20  0940     History Chief Complaint  Patient presents with  . Abdominal Pain    Noah Vasquez is a 40 y.o. male.  HPI 40 year old male with a history of chronic back pain, depression, headache, hypertension, GERD, constipation presents to the ER with complaints of intermittent abdominal pain and swelling in the last few months.  Patient states that he will have bouts of abdominal pain and "swelling" which causes him severe discomfort.  He states that sometimes if his wife's cats walk over his abdomen it will make him want to cry.  He denies any nausea or vomiting.  He is chronically on opioid medication, states he has about 1 bowel movement a day.  He does not take any stool softeners.  He has been having normal bowel movements "for him".  He has also noticed that his bellybutton looks "weird".  Denies any dysuria or flank pain.  No aggravating or alleviating factors.  Pain seems intermittent, when it does come on it is dull and aching in nature.  No fevers or chills.    Past Medical History:  Diagnosis Date  . Anxiety   . Bronchitis   . Chronic back pain   . Depression   . Headache(784.0)   . Hypertension     Patient Active Problem List   Diagnosis Date Noted  . S/P carpal tunnel release left 08/21/19 09/05/2019  . Carpal tunnel syndrome, left upper limb   . Carpal tunnel syndrome of right wrist   . Gastroesophageal reflux disease 07/15/2019  . Low back pain 07/15/2019  . Lumbar spondylosis 07/15/2019  . Moderate recurrent major depression (HCC) 07/15/2019  . Chronic pain syndrome 07/12/2019  . Lumbar post-laminectomy syndrome 07/12/2019  . Tobacco use 05/30/2019  . Hyperlipidemia 09/04/2014  . 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 DEPRESSION 09/19/2007  . CONSTIPATION 09/19/2007  . SMOKER 09/06/2007  . DISC DISEASE, LUMBOSACRAL SPINE 09/06/2007  . INSOMNIA  09/06/2007    Past Surgical History:  Procedure Laterality Date  . BACK SURGERY    . CARPAL TUNNEL RELEASE Right 07/24/2019   Procedure: CARPAL TUNNEL RELEASE;  Surgeon: Vickki Hearing, MD;  Location: [facility];  Service: Orthopedics;  Laterality: Right;  . CARPAL TUNNEL RELEASE Left 08/21/2019   Procedure: CARPAL TUNNEL RELEASE;  Surgeon: Vickki Hearing, MD;  Location: [facility];  Service: Orthopedics;  Laterality: Left;  . HERNIA REPAIR    . SACRAL NERVE STIMULATOR PLACEMENT Bilateral 2014       Family History  Problem Relation Age of Onset  . Diabetes Mother   . Heart disease Mother   . High blood pressure Mother   . Heart disease Father     Social History   Tobacco Use  . Smoking status: Current Every Day Smoker    Packs/day: 1.00    Years: 16.00    Pack years: 16.00    Types: Cigarettes  . Smokeless tobacco: Never Used  Substance Use Topics  . Alcohol use: No    Alcohol/week: 0.0 standard drinks  . Drug use: No    Home Medications Prior to Admission medications   Medication Sig Start Date End Date Taking? Authorizing Provider  albuterol (PROVENTIL HFA;VENTOLIN HFA) 108 (90 BASE) MCG/ACT inhaler Inhale 2 puffs into the lungs every 6 (six) hours as needed for wheezing. 12/18/14   Merlyn Albert, MD  carvedilol (COREG) 6.25 MG tablet Take  6.25 mg by mouth 2 (two) times daily with a meal.    [provider]  cyclobenzaprine (FLEXERIL) 10 MG tablet Take 10 mg by mouth 3 (three) times daily as needed for muscle spasms.    [provider]  dexlansoprazole (DEXILANT) 60 MG capsule Take 60 mg by mouth at bedtime.    [provider]  FLUoxetine (PROZAC) 20 MG capsule Take 20 mg by mouth daily.    [provider]  GRALISE 600 MG TABS Take 1,800 mg by mouth at bedtime.  04/22/19   [provider]  montelukast (SINGULAIR) 10 MG tablet Take 10 mg by mouth at bedtime.    [provider]  naproxen (NAPROSYN) 500 MG tablet  Take 500 mg by mouth 2 (two) times daily with a meal.    [provider]  oxyCODONE-acetaminophen (PERCOCET) 7.5-325 MG tablet Take 1 tablet by mouth every 8 (eight) hours as needed for severe pain.    [provider]    Allergies    Wellbutrin [bupropion] and Bee venom  Review of Systems   Review of Systems  Constitutional: Negative for chills and fever.  HENT: Negative for ear pain and sore throat.   Eyes: Negative for pain and visual disturbance.  Respiratory: Negative for cough and shortness of breath.   Cardiovascular: Negative for chest pain and palpitations.  Gastrointestinal: Positive for abdominal pain. Negative for diarrhea, nausea and vomiting.  Genitourinary: Negative for dysuria and hematuria.  Musculoskeletal: Negative for arthralgias and back pain.  Skin: Negative for color change and rash.  Neurological: Negative for seizures and syncope.  All other systems reviewed and are negative.   Physical Exam Updated Vital Signs BP (!) 136/92 (BP Location: Right Arm)   Pulse 86   Temp 98.6 F (37 C) (Oral)   Resp 15   Ht 5\' 9"  (1.753 m)   Wt 104.3 kg   SpO2 97%   BMI 33.97 kg/m   Physical Exam Vitals and nursing note reviewed.  Constitutional:      General: He is not in acute distress.    Appearance: He is well-developed and well-nourished. He is not ill-appearing, toxic-appearing or diaphoretic.  HENT:     Head: Normocephalic and atraumatic.  Eyes:     Conjunctiva/sclera: Conjunctivae normal.  Cardiovascular:     Rate and Rhythm: Normal rate and regular rhythm.     Heart sounds: No murmur heard.   Pulmonary:     Effort: Pulmonary effort is normal. No respiratory distress.     Breath sounds: Normal breath sounds.  Abdominal:     Palpations: Abdomen is soft.     Tenderness: There is abdominal tenderness in the periumbilical area. There is no right CVA tenderness or left CVA tenderness. Negative signs include Murphy's sign and McBurney's  sign.     Comments: Umbilicus with evidence of hernia.  Reducible.  Mildly tender.  No discoloration.  Musculoskeletal:        General: No edema.     Cervical back: Neck supple.  Skin:    General: Skin is warm and dry.  Neurological:     General: No focal deficit present.     Mental Status: He is alert.  Psychiatric:        Mood and Affect: Mood and affect and mood normal.        Behavior: Behavior normal.     ED Results / Procedures / Treatments   Labs (all labs ordered are listed, but only abnormal results  are displayed) Labs Reviewed  COMPREHENSIVE METABOLIC PANEL - Abnormal; Notable for the following components:      Result Value   Sodium 132 (*)    Glucose, Bld 101 (*)    All other components within normal limits  URINALYSIS, ROUTINE W REFLEX MICROSCOPIC - Abnormal; Notable for the following components:   APPearance HAZY (*)    All other components within normal limits  LIPASE, BLOOD  CBC    EKG None  Radiology CT ABDOMEN PELVIS W CONTRAST  Result Date: 05/02/2020 CLINICAL DATA:  Nonlocalized abdomen pain. EXAM: CT ABDOMEN AND PELVIS WITH CONTRAST TECHNIQUE: Multidetector CT imaging of the abdomen and pelvis was performed using the standard protocol following bolus administration of intravenous contrast. CONTRAST:  OMNIPAQUE IOHEXOL 300 MG/ML  SOLN COMPARISON:  October 23, 2015 FINDINGS: Lower chest: No acute abnormality. Hepatobiliary: No focal liver abnormality is seen. No gallstones, gallbladder wall thickening, or biliary dilatation. Pancreas: Unremarkable. No pancreatic ductal dilatation or surrounding inflammatory changes. Spleen: Normal in size without focal abnormality. Adrenals/Urinary Tract: Adrenal glands are unremarkable. Kidneys are normal, without renal calculi, focal lesion, or hydronephrosis. Bladder is unremarkable. Stomach/Bowel: Stomach is within normal limits. Appendix is not seen but no inflammation is noted around cecum. No evidence of bowel wall  thickening, distention, or inflammatory changes. There is diverticulosis of colon without diverticulitis. Vascular/Lymphatic: Aortic atherosclerosis. No enlarged abdominal or pelvic lymph nodes. Reproductive: Prostate is unremarkable. Other: Umbilical herniation of mesenteric fat is noted. Musculoskeletal: Patient status post prior posterior fusion L5-S1. IMPRESSION: 1. No acute abnormality identified in the abdomen and pelvis. 2. Diverticulosis of colon without diverticulitis. 3. Aortic atherosclerosis. Aortic Atherosclerosis (ICD10-I70.0). Electronically Signed   By: Sherian Rein M.D.   On: 05/02/2020 13:32    Procedures Procedures (including critical care time)  Medications Ordered in ED Medications  iohexol (OMNIPAQUE) 300 MG/ML solution 100 mL (100 mLs Intravenous Contrast Given 05/02/20 1319)    ED Course  I have reviewed the triage vital signs and the nursing notes.  Pertinent labs & imaging results that were available during my care of the patient were reviewed by me and considered in my medical decision making (see chart for details).    MDM Rules/Calculators/A&P                         40 year old male with complaints of periumbilical pain and discomfort Vitals on arrival overall reassuring.  Physical exam with mild central abdominal discomfort, no focal tenderness.  Umbilicus with evidence of hernia, however no evidence of strangulation.  It is reducible.  UA without evidence of blood or UTI, CBC and CMP largely remarkable.  Normal lipase.  CT of the abdomen with no evidence of strangulation of the hernia, small bowel obstruction, appendicitis, diverticulitis, or any other emergency abdominal pathology.  Suspect his discomfort could be secondary to constipation/gas given he is chronically on opioids as well as his umbilical hernia.  Patient was encouraged to start incorporating MiraLAX into his diet.  Encouraged PCP follow-up.  Will refer to general surgery for evaluation of the  umbilical hernia.  Return precautions discussed.  He voiced understanding and is agreeable.  Final Clinical Impression(s) / ED Diagnoses Final diagnoses:  Periumbilical abdominal pain  Umbilical hernia without obstruction and without gangrene    Rx / DC Orders ED Discharge Orders    None       Leone Brand 05/02/20 1403    Bethann Berkshire, MD 05/06/20 1040

## 2020-05-13 ENCOUNTER — Encounter: Payer: Self-pay | Admitting: General Surgery

## 2020-05-13 ENCOUNTER — Ambulatory Visit (INDEPENDENT_AMBULATORY_CARE_PROVIDER_SITE_OTHER): Payer: Medicaid Other | Admitting: General Surgery

## 2020-05-13 ENCOUNTER — Other Ambulatory Visit: Payer: Self-pay

## 2020-05-13 VITALS — BP 119/81 | HR 92 | Temp 98.4°F | Resp 12 | Ht 69.0 in | Wt 245.0 lb

## 2020-05-13 DIAGNOSIS — K429 Umbilical hernia without obstruction or gangrene: Secondary | ICD-10-CM | POA: Diagnosis not present

## 2020-05-13 NOTE — Patient Instructions (Signed)
Umbilical Hernia, Adult  A hernia is a bulge of tissue that pushes through an opening between muscles. An umbilical hernia happens in the abdomen, near the belly button (umbilicus). The hernia may contain tissues from the small intestine, large intestine, or fatty tissue covering the intestines (omentum). Umbilical hernias in adults tend to get worse over time, and they require surgical treatment. There are several types of umbilical hernias. You may have:  A hernia located just above or below the umbilicus (indirect hernia). This is the most common type of umbilical hernia in adults.  A hernia that forms through an opening formed by the umbilicus (direct hernia).  A hernia that comes and goes (reducible hernia). A reducible hernia may be visible only when you strain, lift something heavy, or cough. This type of hernia can be pushed back into the abdomen (reduced).  A hernia that traps abdominal tissue inside the hernia (incarcerated hernia). This type of hernia cannot be reduced.  A hernia that cuts off blood flow to the tissues inside the hernia (strangulated hernia). The tissues can start to die if this happens. This type of hernia requires emergency treatment. What are the causes? An umbilical hernia happens when tissue inside the abdomen presses on a weak area of the abdominal muscles. What increases the risk? You may have a greater risk of this condition if you:  Are obese.  Have had several pregnancies.  Have a buildup of fluid inside your abdomen (ascites).  Have had surgery that weakens the abdominal muscles. What are the signs or symptoms? The main symptom of this condition is a painless bulge at or near the belly button. A reducible hernia may be visible only when you strain, lift something heavy, or cough. Other symptoms may include:  Dull pain.  A feeling of pressure. Symptoms of a strangulated hernia may include:  Pain that gets increasingly worse.  Nausea and  vomiting.  Pain when pressing on the hernia.  Skin over the hernia becoming red or purple.  Constipation.  Blood in the stool. How is this diagnosed? This condition may be diagnosed based on:  A physical exam. You may be asked to cough or strain while standing. These actions increase the pressure inside your abdomen and force the hernia through the opening in your muscles. Your health care provider may try to reduce the hernia by pressing on it.  Your symptoms and medical history. How is this treated? Surgery is the only treatment for an umbilical hernia. Surgery for a strangulated hernia is done as soon as possible. If you have a small hernia that is not incarcerated, you may need to lose weight before having surgery. Follow these instructions at home:  Lose weight, if told by your health care provider.  Do not try to push the hernia back in.  Watch your hernia for any changes in color or size. Tell your health care provider if any changes occur.  You may need to avoid activities that increase pressure on your hernia.  Do not lift anything that is heavier than 10 lb (4.5 kg) until your health care provider says that this is safe.  Take over-the-counter and prescription medicines only as told by your health care provider.  Keep all follow-up visits as told by your health care provider. This is important. Contact a health care provider if:  Your hernia gets larger.  Your hernia becomes painful. Get help right away if:  You develop sudden, severe pain near the area of your hernia.    You have pain as well as nausea or vomiting.  You have pain and the skin over your hernia changes color.  You develop a fever. This information is not intended to replace advice given to you by your health care provider. Make sure you discuss any questions you have with your health care provider. Document Revised: 05/30/2017 Document Reviewed: 10/16/2016 Elsevier Patient Education  2021  [employer]    Open Hernia Repair, Adult Open hernia repair is a surgical procedure to fix a hernia. A hernia occurs when an internal organ or tissue pushes through a weak spot in the muscles along the wall of the abdomen. Hernias commonly occur in the groin and around the belly button. Most hernias tend to get worse over time. Often, surgery is done to prevent the hernia from becoming bigger, uncomfortable, or an emergency. Emergency surgery may be needed if contents of the abdomen get stuck in the opening (incarcerated hernia) or if the blood supply gets cut off (strangulated hernia). In an open repair, an incision is made in the abdomen to perform the surgery. Tell a health care provider about:  Any allergies you have.  All medicines you are taking, including vitamins, herbs, eye drops, creams, and over-the-counter medicines.  Any problems you or family members have had with anesthetic medicines.  Any blood or bone disorders you have.  Any surgeries you have had.  Any medical conditions you have, including any recent cold or flu (influenza)symptoms.  Whether you are pregnant or may be pregnant. What are the risks? Generally, this is a safe procedure. However, problems may occur, including:  Long-lasting (chronic) pain.  Bleeding.  Infection.  Damage to the testicles. This can cause shrinking or swelling.  Damage to nearby structures or organs, including the bladder, blood vessels, intestines, or nerves near the hernia.  Blood clots.  Trouble passing urine.  Return of the hernia. Medicines Ask your health care provider about:  Changing or stopping your regular medicines. This is especially important if you are taking diabetes medicines or blood thinners.  Taking medicines such as aspirin and ibuprofen. These medicines can thin your blood. Do not take these medicines unless your health care provider tells you to take them.  Taking over-the-counter medicines,  vitamins, herbs, and supplements. Surgery safety Ask your health care provider:  How your surgery site will be marked.  What steps will be taken to help prevent infection. These steps may include: ? Removing hair at the surgery site. ? Washing skin with a germ-killing soap. ? Receiving antibiotic medicine. General instructions  You may have an exam or testing, such as blood tests or imaging studies.  Do not use any products that contain nicotine or tobacco for at least 4 weeks before the procedure. These products include cigarettes, chewing tobacco, and vaping devices, such as e-cigarettes. If you need help quitting, ask your health care provider.  Let your health care provider know if you develop a cold or any infection before your surgery. If you get an infection before surgery, you may receive antibiotics to treat it.  Plan to have a responsible adult take you home from the hospital or clinic.  If you will be going home right after the procedure, plan to have a responsible adult care for you for the time you are told. This is important. What happens during the procedure?  An IV will be inserted into one of your veins.  You will be given one or more of the following: ? A medicine   to help you relax (sedative). ? A medicine to numb the area (local anesthetic). ? A medicine to make you fall asleep (general anesthetic).  Your surgeon will make an incision over the hernia.  The tissues of the hernia will be moved back into place.  The edges of the hernia may be stitched (sutured) together.  The opening in the abdominal muscles will be closed with stitches (sutures). Or, your surgeon will place a mesh patch made of artificial (synthetic) material over the opening.  The incision will be closed with sutures, skin glue, or adhesive strips.  A bandage (dressing) may be placed over the incision. The procedure may vary among health care providers and hospitals.   What happens after  the procedure?  Your blood pressure, heart rate, breathing rate, and blood oxygen level will be monitored until you leave the hospital or clinic.  You may be given medicine for pain.  If you were given a sedative during the procedure, it can affect you for several hours. Do not drive or operate machinery until your health care provider says that it is safe. Summary  Open hernia repair is a surgical procedure to fix a hernia. Hernias commonly occur in the groin and around the belly button.  Emergency surgery may be needed if contents of the abdomen get stuck in the opening (incarcerated hernia) or if the blood supply gets cut off (strangulated hernia).  In this procedure, an incision is made in the abdomen to perform the surgery.  After the procedure, you may be given medicine for pain. This information is not intended to replace advice given to you by your health care provider. Make sure you discuss any questions you have with your health care provider. Document Revised: 12/01/2019 Document Reviewed: 12/01/2019 Elsevier Patient Education  2021 [employer].

## 2020-05-13 NOTE — Progress Notes (Signed)
[facility] History and Physical  Reason for Referral: Umbilical hernia  Referring Physician: ED   Chief Complaint    Umbilical Hernia      ASTOR GENTLE is a 40 y.o. male.  HPI: Ms. Berthold is a 40 yo who presented to the ED abdominal pain and swelling and pain when something puts pressure on his abdomen like his cat.  He denied any nausea or vomiting. He has regular BMs but is on chronic pain medication for back pain. He has noticed an umbilical bulge for the last several weeks. The pain is intermittent and aching in nature.   Past Medical History:  Diagnosis Date  . Anxiety   . Bronchitis   . Chronic back pain   . Depression   . Headache(784.0)   . Hypertension     Past Surgical History:  Procedure Laterality Date  . BACK SURGERY    . CARPAL TUNNEL RELEASE Right 07/24/2019   Procedure: CARPAL TUNNEL RELEASE;  Surgeon: Vickki Hearing, MD;  Location: [facility];  Service: Orthopedics;  Laterality: Right;  . CARPAL TUNNEL RELEASE Left 08/21/2019   Procedure: CARPAL TUNNEL RELEASE;  Surgeon: Vickki Hearing, MD;  Location: [facility];  Service: Orthopedics;  Laterality: Left;  . HERNIA REPAIR    . SACRAL NERVE STIMULATOR PLACEMENT Bilateral 2014    Family History  Problem Relation Age of Onset  . Diabetes Mother   . Heart disease Mother   . High blood pressure Mother   . Heart disease Father     Social History   Tobacco Use  . Smoking status: Current Every Day Smoker    Packs/day: 1.00    Years: 16.00    Pack years: 16.00    Types: Cigarettes  . Smokeless tobacco: Never Used  Substance Use Topics  . Alcohol use: No    Alcohol/week: 0.0 standard drinks  . Drug use: No    Medications: I have reviewed the patient's current medications. Allergies as of 05/13/2020      Reactions   Wellbutrin [bupropion] Other (See Comments)   Makes pt mean   Bee Venom Swelling      Medication List       Accurate as of May 13, 2020  2:36 PM. If you  have any questions, ask your nurse or doctor.        STOP taking these medications   cyclobenzaprine 10 MG tablet Commonly known as: FLEXERIL Stopped by: Lucretia Roers, MD   naproxen 500 MG tablet Commonly known as: NAPROSYN Stopped by: Lucretia Roers, MD   oxyCODONE-acetaminophen 7.5-325 MG tablet Commonly known as: PERCOCET Stopped by: Lucretia Roers, MD     TAKE these medications   albuterol 108 (90 Base) MCG/ACT inhaler Commonly known as: VENTOLIN HFA Inhale 2 puffs into the lungs every 6 (six) hours as needed for wheezing.   carvedilol 6.25 MG tablet Commonly known as: COREG Take 6.25 mg by mouth 2 (two) times daily with a meal.   dexlansoprazole 60 MG capsule Commonly known as: DEXILANT Take 60 mg by mouth at bedtime.   FLUoxetine 20 MG capsule Commonly known as: PROZAC Take 20 mg by mouth daily.   Gralise 600 MG Tabs Generic drug: Gabapentin (Once-Daily) Take 1,800 mg by mouth at bedtime.   montelukast 10 MG tablet Commonly known as: SINGULAIR Take 10 mg by mouth at bedtime.        ROS:  A comprehensive review of systems was negative except for: Gastrointestinal:  positive for abdominal pain Musculoskeletal: positive for back pain  Blood pressure 119/81, pulse 92, temperature 98.4 F (36.9 C), temperature source Oral, resp. rate 12, height 5\' 9"  (1.753 m), weight 245 lb (111.1 kg), SpO2 95 %. Physical Exam Vitals reviewed.  Constitutional:      Appearance: Normal appearance.  HENT:     Head: Normocephalic.     Nose: Nose normal.     Mouth/Throat:     Mouth: Mucous membranes are moist.  Eyes:     Extraocular Movements: Extraocular movements intact.  Cardiovascular:     Rate and Rhythm: Normal rate and regular rhythm.  Pulmonary:     Effort: Pulmonary effort is normal.     Breath sounds: Normal breath sounds.  Abdominal:     General: There is no distension.     Palpations: Abdomen is soft.     Tenderness: There is abdominal  tenderness in the periumbilical area.     Hernia: A hernia is present. Hernia is present in the umbilical area.     Comments: Reducible hernia but tender surrounding  Musculoskeletal:        General: No swelling. Normal range of motion.     Cervical back: Normal range of motion.  Skin:    General: Skin is warm and dry.  Neurological:     General: No focal deficit present.     Mental Status: He is alert and oriented to person, place, and time.     Results: Personally reviewed- small fat containing hernia, defect measuring 1.5cm  CLINICAL DATA:  Nonlocalized abdomen pain.  EXAM: CT ABDOMEN AND PELVIS WITH CONTRAST  TECHNIQUE: Multidetector CT imaging of the abdomen and pelvis was performed using the standard protocol following bolus administration of intravenous contrast.  CONTRAST:  OMNIPAQUE IOHEXOL 300 MG/ML  SOLN  COMPARISON:  October 23, 2015  FINDINGS: Lower chest: No acute abnormality.  Hepatobiliary: No focal liver abnormality is seen. No gallstones, gallbladder wall thickening, or biliary dilatation.  Pancreas: Unremarkable. No pancreatic ductal dilatation or surrounding inflammatory changes.  Spleen: Normal in size without focal abnormality.  Adrenals/Urinary Tract: Adrenal glands are unremarkable. Kidneys are normal, without renal calculi, focal lesion, or hydronephrosis. Bladder is unremarkable.  Stomach/Bowel: Stomach is within normal limits. Appendix is not seen but no inflammation is noted around cecum. No evidence of bowel wall thickening, distention, or inflammatory changes. There is diverticulosis of colon without diverticulitis.  Vascular/Lymphatic: Aortic atherosclerosis. No enlarged abdominal or pelvic lymph nodes.  Reproductive: Prostate is unremarkable.  Other: Umbilical herniation of mesenteric fat is noted.  Musculoskeletal: Patient status post prior posterior fusion L5-S1.  IMPRESSION: 1. No acute abnormality  identified in the abdomen and pelvis. 2. Diverticulosis of colon without diverticulitis. 3. Aortic atherosclerosis.  Aortic Atherosclerosis (ICD10-I70.0).   Electronically Signed   By: October 25, 2015 M.D.  Assessment & Plan:  ZONG MCQUARRIE is a 40 y.o. male with a small umbilical hernia. Discusses risk of repair and risk of bleeding, infection, use of mesh, injury to bowel and recurrence. Discussed his pain contract and that roxicodone is the normal medication I prescribe and I give people 15. He said he should be fine with his pain medication that he has for his back as he can take those every 6 hours.   Discussed preop COVID testing.  Discussed reasons to go back to ED between now and repair.   All questions were answered to the satisfaction of the patient.   24 05/13/2020, 2:36 PM

## 2020-05-18 NOTE — H&P (Signed)
[facility] History and Physical  Reason for Referral: Umbilical hernia  Referring Physician: ED   Chief Complaint    Umbilical Hernia      Noah Vasquez is a 39 y.o. male.  HPI: Ms. Jolley is a 39 yo who presented to the ED abdominal pain and swelling and pain when something puts pressure on his abdomen like his cat.  He denied any nausea or vomiting. He has regular BMs but is on chronic pain medication for back pain. He has noticed an umbilical bulge for the last several weeks. The pain is intermittent and aching in nature.   Past Medical History:  Diagnosis Date  . Anxiety   . Bronchitis   . Chronic back pain   . Depression   . Headache(784.0)   . Hypertension     Past Surgical History:  Procedure Laterality Date  . BACK SURGERY    . CARPAL TUNNEL RELEASE Right 07/24/2019   Procedure: CARPAL TUNNEL RELEASE;  Surgeon: Harrison, Stanley E, MD;  Location: [facility];  Service: Orthopedics;  Laterality: Right;  . CARPAL TUNNEL RELEASE Left 08/21/2019   Procedure: CARPAL TUNNEL RELEASE;  Surgeon: Harrison, Stanley E, MD;  Location: [facility];  Service: Orthopedics;  Laterality: Left;  . HERNIA REPAIR    . SACRAL NERVE STIMULATOR PLACEMENT Bilateral 2014    Family History  Problem Relation Age of Onset  . Diabetes Mother   . Heart disease Mother   . High blood pressure Mother   . Heart disease Father     Social History   Tobacco Use  . Smoking status: Current Every Day Smoker    Packs/day: 1.00    Years: 16.00    Pack years: 16.00    Types: Cigarettes  . Smokeless tobacco: Never Used  Substance Use Topics  . Alcohol use: No    Alcohol/week: 0.0 standard drinks  . Drug use: No    Medications: I have reviewed the patient's current medications. Allergies as of 05/13/2020      Reactions   Wellbutrin [bupropion] Other (See Comments)   Makes pt mean   Bee Venom Swelling      Medication List       Accurate as of May 13, 2020  2:36 PM. If you  have any questions, ask your nurse or doctor.        STOP taking these medications   cyclobenzaprine 10 MG tablet Commonly known as: FLEXERIL Stopped by: Ethelyne Erich C Ngina Royer, MD   naproxen 500 MG tablet Commonly known as: NAPROSYN Stopped by: Unnamed Hino C Adolphe Fortunato, MD   oxyCODONE-acetaminophen 7.5-325 MG tablet Commonly known as: PERCOCET Stopped by: Sherron Mummert C Simmone Cape, MD     TAKE these medications   albuterol 108 (90 Base) MCG/ACT inhaler Commonly known as: VENTOLIN HFA Inhale 2 puffs into the lungs every 6 (six) hours as needed for wheezing.   carvedilol 6.25 MG tablet Commonly known as: COREG Take 6.25 mg by mouth 2 (two) times daily with a meal.   dexlansoprazole 60 MG capsule Commonly known as: DEXILANT Take 60 mg by mouth at bedtime.   FLUoxetine 20 MG capsule Commonly known as: PROZAC Take 20 mg by mouth daily.   Gralise 600 MG Tabs Generic drug: Gabapentin (Once-Daily) Take 1,800 mg by mouth at bedtime.   montelukast 10 MG tablet Commonly known as: SINGULAIR Take 10 mg by mouth at bedtime.        ROS:  A comprehensive review of systems was negative except for: Gastrointestinal:   positive for abdominal pain Musculoskeletal: positive for back pain  Blood pressure 119/81, pulse 92, temperature 98.4 F (36.9 C), temperature source Oral, resp. rate 12, height 5' 9" (1.753 m), weight 245 lb (111.1 kg), SpO2 95 %. Physical Exam Vitals reviewed.  Constitutional:      Appearance: Normal appearance.  HENT:     Head: Normocephalic.     Nose: Nose normal.     Mouth/Throat:     Mouth: Mucous membranes are moist.  Eyes:     Extraocular Movements: Extraocular movements intact.  Cardiovascular:     Rate and Rhythm: Normal rate and regular rhythm.  Pulmonary:     Effort: Pulmonary effort is normal.     Breath sounds: Normal breath sounds.  Abdominal:     General: There is no distension.     Palpations: Abdomen is soft.     Tenderness: There is abdominal  tenderness in the periumbilical area.     Hernia: A hernia is present. Hernia is present in the umbilical area.     Comments: Reducible hernia but tender surrounding  Musculoskeletal:        General: No swelling. Normal range of motion.     Cervical back: Normal range of motion.  Skin:    General: Skin is warm and dry.  Neurological:     General: No focal deficit present.     Mental Status: He is alert and oriented to person, place, and time.     Results: Personally reviewed- small fat containing hernia, defect measuring 1.5cm  CLINICAL DATA:  Nonlocalized abdomen pain.  EXAM: CT ABDOMEN AND PELVIS WITH CONTRAST  TECHNIQUE: Multidetector CT imaging of the abdomen and pelvis was performed using the standard protocol following bolus administration of intravenous contrast.  CONTRAST:  100mL OMNIPAQUE IOHEXOL 300 MG/ML  SOLN  COMPARISON:  October 23, 2015  FINDINGS: Lower chest: No acute abnormality.  Hepatobiliary: No focal liver abnormality is seen. No gallstones, gallbladder wall thickening, or biliary dilatation.  Pancreas: Unremarkable. No pancreatic ductal dilatation or surrounding inflammatory changes.  Spleen: Normal in size without focal abnormality.  Adrenals/Urinary Tract: Adrenal glands are unremarkable. Kidneys are normal, without renal calculi, focal lesion, or hydronephrosis. Bladder is unremarkable.  Stomach/Bowel: Stomach is within normal limits. Appendix is not seen but no inflammation is noted around cecum. No evidence of bowel wall thickening, distention, or inflammatory changes. There is diverticulosis of colon without diverticulitis.  Vascular/Lymphatic: Aortic atherosclerosis. No enlarged abdominal or pelvic lymph nodes.  Reproductive: Prostate is unremarkable.  Other: Umbilical herniation of mesenteric fat is noted.  Musculoskeletal: Patient status post prior posterior fusion L5-S1.  IMPRESSION: 1. No acute abnormality  identified in the abdomen and pelvis. 2. Diverticulosis of colon without diverticulitis. 3. Aortic atherosclerosis.  Aortic Atherosclerosis (ICD10-I70.0).   Electronically Signed   By: Wei-Chen  Lin M.D.  Assessment & Plan:  Noah Vasquez is a 39 y.o. male with a small umbilical hernia. Discusses risk of repair and risk of bleeding, infection, use of mesh, injury to bowel and recurrence. Discussed his pain contract and that roxicodone is the normal medication I prescribe and I give people 15. He said he should be fine with his pain medication that he has for his back as he can take those every 6 hours.   Discussed preop COVID testing.  Discussed reasons to go back to ED between now and repair.   All questions were answered to the satisfaction of the patient.   Monee Dembeck C Naythen Heikkila 05/13/2020, 2:36 PM       

## 2020-05-24 ENCOUNTER — Other Ambulatory Visit: Payer: Self-pay

## 2020-05-24 ENCOUNTER — Other Ambulatory Visit (HOSPITAL_COMMUNITY)
Admission: RE | Admit: 2020-05-24 | Discharge: 2020-05-24 | Disposition: A | Discharge status: Home / Self Care | Payer: Medicaid Other | Source: Ambulatory Visit | Attending: General Surgery | Admitting: General Surgery

## 2020-05-24 ENCOUNTER — Encounter (HOSPITAL_COMMUNITY)
Admission: RE | Admit: 2020-05-24 | Discharge: 2020-05-24 | Disposition: A | Discharge status: Home / Self Care | Payer: Medicaid Other | Source: Ambulatory Visit | Attending: General Surgery | Admitting: General Surgery

## 2020-05-24 DIAGNOSIS — Z01812 Encounter for preprocedural laboratory examination: Secondary | ICD-10-CM | POA: Insufficient documentation

## 2020-05-24 DIAGNOSIS — Z20822 Contact with and (suspected) exposure to covid-19: Secondary | ICD-10-CM | POA: Diagnosis not present

## 2020-05-24 NOTE — Patient Instructions (Addendum)
Noah CanalesJamie A Vasquez  05/24/2020     @[email or web address] @   Your procedure is scheduled on 05/26/2020.  Report [facility] at 7:00 A.M.  Call this number if you have problems the morning of surgery:  [phone]   Remember:  Do not eat or drink after midnight.       Take these medicines the morning of surgery with A SIP OF WATER : Carvedilol, Dexilant and Prozac    Do not wear jewelry, make-up or nail polish.  Do not wear lotions, powders, or perfumes, or deodorant.  Do not shave 48 hours prior to surgery.  Men may shave face and neck.  Do not bring valuables to the hospital.  [facility] belongings or valuables.  Contacts, dentures or bridgework may not be worn into surgery.  Leave your suitcase in the car.  After surgery it may be brought to your room.  For patients admitted to the hospital, discharge time will be determined by your treatment team.  Patients discharged the day of surgery will not be allowed to drive home.   Name and phone number of your driver:   family Special instructions:  n/a  Please read over the following fact sheets that you were given. Care and Recovery After Surgery    Umbilical Hernia, Adult  A hernia is a bulge of tissue that pushes through an opening between muscles. An umbilical hernia happens in the abdomen, near the belly button (umbilicus). The hernia may contain tissues from the small intestine, large intestine, or fatty tissue covering the intestines (omentum). Umbilical hernias in adults tend to get worse over time, and they require surgical treatment. There are several types of umbilical hernias. You may have:  A hernia located just above or below the umbilicus (indirect hernia). This is the most common type of umbilical hernia in adults.  A hernia that forms through an opening formed by the umbilicus (direct hernia).  A hernia that comes and goes (reducible hernia). A reducible hernia may be visible only  when you strain, lift something heavy, or cough. This type of hernia can be pushed back into the abdomen (reduced).  A hernia that traps abdominal tissue inside the hernia (incarcerated hernia). This type of hernia cannot be reduced.  A hernia that cuts off blood flow to the tissues inside the hernia (strangulated hernia). The tissues can start to die if this happens. This type of hernia requires emergency treatment. What are the causes? An umbilical hernia happens when tissue inside the abdomen presses on a weak area of the abdominal muscles. What increases the risk? You may have a greater risk of this condition if you:  Are obese.  Have had several pregnancies.  Have a buildup of fluid inside your abdomen (ascites).  Have had surgery that weakens the abdominal muscles. What are the signs or symptoms? The main symptom of this condition is a painless bulge at or near the belly button. A reducible hernia may be visible only when you strain, lift something heavy, or cough. Other symptoms may include:  Dull pain.  A feeling of pressure. Symptoms of a strangulated hernia may include:  Pain that gets increasingly worse.  Nausea and vomiting.  Pain when pressing on the hernia.  Skin over the hernia becoming red or purple.  Constipation.  Blood in the stool. How is this diagnosed? This condition may be diagnosed based on:  A physical exam. You may be asked to cough or strain  while standing. These actions increase the pressure inside your abdomen and force the hernia through the opening in your muscles. Your health care provider may try to reduce the hernia by pressing on it.  Your symptoms and medical history. How is this treated? Surgery is the only treatment for an umbilical hernia. Surgery for a strangulated hernia is done as soon as possible. If you have a small hernia that is not incarcerated, you may need to lose weight before having surgery. Follow these instructions at  home:  Lose weight, if told by your health care provider.  Do not try to push the hernia back in.  Watch your hernia for any changes in color or size. Tell your health care provider if any changes occur.  You may need to avoid activities that increase pressure on your hernia.  Do not lift anything that is heavier than 10 lb (4.5 kg) until your health care provider says that this is safe.  Take over-the-counter and prescription medicines only as told by your health care provider.  Keep all follow-up visits as told by your health care provider. This is important. Contact a health care provider if:  Your hernia gets larger.  Your hernia becomes painful. Get help right away if:  You develop sudden, severe pain near the area of your hernia.  You have pain as well as nausea or vomiting.  You have pain and the skin over your hernia changes color.  You develop a fever. This information is not intended to replace advice given to you by your health care provider. Make sure you discuss any questions you have with your health care provider. Document Revised: 05/30/2017 Document Reviewed: 10/16/2016 Elsevier Patient Education  2021 [employer]  General Anesthesia, Adult General anesthesia is the use of medicines to make a person "go to sleep" (unconscious) for a medical procedure. General anesthesia must be used for certain procedures, and is often recommended for procedures that:  Last a long time.  Require you to be still or in an unusual position.  Are major and can cause blood loss. The medicines used for general anesthesia are called general anesthetics. As well as making you unconscious for a certain amount of time, these medicines:  Prevent pain.  Control your blood pressure.  Relax your muscles. Tell a health care provider about:  Any allergies you have.  All medicines you are taking, including vitamins, herbs, eye drops, creams, and over-the-counter  medicines.  Any problems you or family members have had with anesthetic medicines.  Types of anesthetics you have had in the past.  Any blood disorders you have.  Any surgeries you have had.  Any medical conditions you have.  Any recent upper respiratory, chest, or ear infections.  Any history of: ? Heart or lung conditions, such as heart failure, sleep apnea, asthma, or chronic obstructive pulmonary disease (COPD). ? Financial planner. ? Depression or anxiety.  Any tobacco or drug use, including marijuana or alcohol use.  Whether you are pregnant or may be pregnant. What are the risks? Generally, this is a safe procedure. However, problems may occur, including:  Allergic reaction.  Lung and heart problems.  Inhaling food or liquid from the stomach into the lungs (aspiration).  Nerve injury.  Dental injury.  Air in the bloodstream, which can lead to stroke.  Extreme agitation or confusion (delirium) when you wake up from the anesthetic.  Waking up during your procedure and being unable to move. This is rare. These problems are  more likely to develop if you are having a major surgery or if you have an advanced or serious medical condition. You can prevent some of these complications by answering all of your health care provider's questions thoroughly and by following all instructions before your procedure. General anesthesia can cause side effects, including:  Nausea or vomiting.  A sore throat from the breathing tube.  Hoarseness.  Wheezing or coughing.  Shaking chills.  Tiredness.  Body aches.  Anxiety.  Sleepiness or drowsiness.  Confusion or agitation. What happens before the procedure? Staying hydrated Follow instructions from your health care provider about hydration, which may include:  Up to 2 hours before the procedure - you may continue to drink clear liquids, such as water, clear fruit juice, black coffee, and plain tea.   Eating and  drinking restrictions Follow instructions from your health care provider about eating and drinking, which may include:  8 hours before the procedure - stop eating heavy meals or foods such as meat, fried foods, or fatty foods.  6 hours before the procedure - stop eating light meals or foods, such as toast or cereal.  6 hours before the procedure - stop drinking milk or drinks that contain milk.  2 hours before the procedure - stop drinking clear liquids. Medicines Ask your health care provider about:  Changing or stopping your regular medicines. This is especially important if you are taking diabetes medicines or blood thinners.  Taking medicines such as aspirin and ibuprofen. These medicines can thin your blood. Do not take these medicines unless your health care provider tells you to take them.  Taking over-the-counter medicines, vitamins, herbs, and supplements. Do not take these during the week before your procedure unless your health care provider approves them. General instructions  Starting 3-6 weeks before the procedure, do not use any products that contain nicotine or tobacco, such as cigarettes and e-cigarettes. If you need help quitting, ask your health care provider.  If you brush your teeth on the morning of the procedure, make sure to spit out all of the toothpaste.  Tell your health care provider if you become ill or develop a cold, cough, or fever.  If instructed by your health care provider, bring your sleep apnea device with you on the day of your surgery (if applicable).  Ask your health care provider if you will be going home the same day, the following day, or after a longer hospital stay. ? Plan to have a responsible adult take you home from the hospital or clinic. ? Plan to have a responsible adult care for you for the time you are told after you leave the hospital or clinic. This is important. What happens during the procedure?  You will be given anesthetics  through both of the following: ? A mask placed over your nose and mouth. ? An IV in one of your veins.  You may receive a medicine to help you relax (sedative).  After you are unconscious, a breathing tube may be inserted down your throat to help you breathe. This will be removed before you wake up.  An anesthesia specialist will stay with you throughout your procedure. He or she will: ? Keep you comfortable and safe by continuing to give you medicines and adjusting the amount of medicine that you get. ? Monitor your blood pressure, pulse, and oxygen levels to make sure that the anesthetics do not cause any problems. The procedure may vary among health care providers and hospitals.  What happens after the procedure?  Your blood pressure, temperature, heart rate, breathing rate, and blood oxygen level will be monitored until the medicines you were given have worn off.  You will wake up in a recovery area. You may wake up slowly.  If you feel anxious or agitated, you may be given medicine to help you calm down.  If you will be going home the same day, your health care provider may check to make sure you can walk, drink, and urinate.  Your health care provider will treat any pain or side effects you have before you go home.  Do not drive or operate machinery until your health care provider says that it is safe. Summary  General anesthesia is used to keep you still and prevent pain during a procedure.  It is important to tell your health care provider about your medical history and any surgeries you have had, and previous experience with anesthesia.  Follow your health care provider's instructions about when to stop eating, drinking, or taking certain medicines before your procedure.  Plan to have a responsible adult take you home from the hospital or clinic. This information is not intended to replace advice given to you by your health care provider. Make sure you discuss any  questions you have with your health care provider. Document Revised: 12/29/2019 Document Reviewed: 07/30/2019 Elsevier Patient Education  2021 [employer].

## 2020-05-25 LAB — SARS CORONAVIRUS 2 (TAT 6-24 HRS): SARS Coronavirus 2: NEGATIVE

## 2020-05-26 ENCOUNTER — Encounter (HOSPITAL_COMMUNITY)
Admission: RE | Disposition: A | Discharge status: Home / Self Care | Payer: Self-pay | Source: Home / Self Care | Attending: General Surgery

## 2020-05-26 ENCOUNTER — Encounter (HOSPITAL_COMMUNITY): Payer: Self-pay | Admitting: General Surgery

## 2020-05-26 ENCOUNTER — Ambulatory Visit (HOSPITAL_COMMUNITY): Payer: Medicaid Other | Admitting: Anesthesiology

## 2020-05-26 ENCOUNTER — Ambulatory Visit (HOSPITAL_COMMUNITY)
Admission: RE | Admit: 2020-05-26 | Discharge: 2020-05-26 | Disposition: A | Discharge status: Home / Self Care | Payer: Medicaid Other | Attending: General Surgery | Admitting: General Surgery

## 2020-05-26 DIAGNOSIS — Z79899 Other long term (current) drug therapy: Secondary | ICD-10-CM | POA: Insufficient documentation

## 2020-05-26 DIAGNOSIS — Z888 Allergy status to other drugs, medicaments and biological substances status: Secondary | ICD-10-CM | POA: Diagnosis not present

## 2020-05-26 DIAGNOSIS — F17210 Nicotine dependence, cigarettes, uncomplicated: Secondary | ICD-10-CM | POA: Diagnosis not present

## 2020-05-26 DIAGNOSIS — K429 Umbilical hernia without obstruction or gangrene: Secondary | ICD-10-CM | POA: Diagnosis not present

## 2020-05-26 HISTORY — PX: UMBILICAL HERNIA REPAIR: SHX196

## 2020-05-26 SURGERY — REPAIR, HERNIA, UMBILICAL, ADULT
Anesthesia: General | Site: Abdomen

## 2020-05-26 MED ORDER — ONDANSETRON HCL 4 MG/2ML IJ SOLN: 4.0000 mg | Freq: Once | INTRAMUSCULAR | Status: DC | PRN

## 2020-05-26 MED ORDER — CHLORHEXIDINE GLUCONATE 0.12 % MT SOLN
15.0000 mL | Freq: Once | OROMUCOSAL | Status: AC
  Administered 2020-05-26: 15 mL via OROMUCOSAL

## 2020-05-26 MED ORDER — HYDROMORPHONE HCL 1 MG/ML IJ SOLN
0.2500 mg | INTRAMUSCULAR | Status: DC | PRN
  Administered 2020-05-26 (×4): 0.5 mg via INTRAVENOUS
  Filled 2020-05-26 (×3): qty 0.5

## 2020-05-26 MED ORDER — KETOROLAC TROMETHAMINE 30 MG/ML IJ SOLN
INTRAMUSCULAR | Status: AC
  Filled 2020-05-26: qty 1

## 2020-05-26 MED ORDER — PROPOFOL 10 MG/ML IV BOLUS
INTRAVENOUS | Status: DC | PRN
  Administered 2020-05-26: 200 mg via INTRAVENOUS

## 2020-05-26 MED ORDER — ONDANSETRON HCL 4 MG PO TABS: 4.0000 mg | ORAL_TABLET | Freq: Three times a day (TID) | ORAL | 0 refills | Status: AC | PRN

## 2020-05-26 MED ORDER — ONDANSETRON HCL 4 MG/2ML IJ SOLN
INTRAMUSCULAR | Status: AC
  Filled 2020-05-26: qty 2

## 2020-05-26 MED ORDER — BUPIVACAINE LIPOSOME 1.3 % IJ SUSP
INTRAMUSCULAR | Status: AC
  Filled 2020-05-26: qty 20

## 2020-05-26 MED ORDER — FENTANYL CITRATE (PF) 100 MCG/2ML IJ SOLN
INTRAMUSCULAR | Status: AC
  Filled 2020-05-26: qty 4

## 2020-05-26 MED ORDER — LIDOCAINE HCL (CARDIAC) PF 100 MG/5ML IV SOSY
PREFILLED_SYRINGE | INTRAVENOUS | Status: DC | PRN
  Administered 2020-05-26: 100 mg via INTRATRACHEAL

## 2020-05-26 MED ORDER — PROPOFOL 10 MG/ML IV BOLUS
INTRAVENOUS | Status: AC
  Filled 2020-05-26: qty 40

## 2020-05-26 MED ORDER — CHLORHEXIDINE GLUCONATE CLOTH 2 % EX PADS: 6.0000 | MEDICATED_PAD | Freq: Once | CUTANEOUS | Status: DC

## 2020-05-26 MED ORDER — MIDAZOLAM HCL 2 MG/2ML IJ SOLN
INTRAMUSCULAR | Status: AC
  Filled 2020-05-26: qty 2

## 2020-05-26 MED ORDER — BUPIVACAINE LIPOSOME 1.3 % IJ SUSP
INTRAMUSCULAR | Status: DC | PRN
  Administered 2020-05-26: 20 mL

## 2020-05-26 MED ORDER — HYDROMORPHONE HCL 1 MG/ML IJ SOLN
INTRAMUSCULAR | Status: AC
  Filled 2020-05-26: qty 0.5

## 2020-05-26 MED ORDER — LACTATED RINGERS IV SOLN: INTRAVENOUS | Status: DC

## 2020-05-26 MED ORDER — CHLORHEXIDINE GLUCONATE 0.12 % MT SOLN
OROMUCOSAL | Status: AC
  Filled 2020-05-26: qty 15

## 2020-05-26 MED ORDER — SODIUM CHLORIDE 0.9 % IR SOLN
Status: DC | PRN
  Administered 2020-05-26: 1000 mL

## 2020-05-26 MED ORDER — LIDOCAINE HCL (PF) 2 % IJ SOLN
INTRAMUSCULAR | Status: AC
  Filled 2020-05-26: qty 5

## 2020-05-26 MED ORDER — ONDANSETRON HCL 4 MG/2ML IJ SOLN
INTRAMUSCULAR | Status: DC | PRN
  Administered 2020-05-26: 4 mg via INTRAVENOUS

## 2020-05-26 MED ORDER — CEFAZOLIN SODIUM-DEXTROSE 2-4 GM/100ML-% IV SOLN
2.0000 g | INTRAVENOUS | Status: AC
  Administered 2020-05-26: 2 g via INTRAVENOUS

## 2020-05-26 MED ORDER — DEXAMETHASONE SODIUM PHOSPHATE 10 MG/ML IJ SOLN
INTRAMUSCULAR | Status: DC | PRN
  Administered 2020-05-26: 10 mg via INTRAVENOUS

## 2020-05-26 MED ORDER — CEFAZOLIN SODIUM-DEXTROSE 2-4 GM/100ML-% IV SOLN
INTRAVENOUS | Status: AC
  Filled 2020-05-26: qty 100

## 2020-05-26 MED ORDER — FENTANYL CITRATE (PF) 100 MCG/2ML IJ SOLN
INTRAMUSCULAR | Status: DC | PRN
  Administered 2020-05-26 (×4): 50 ug via INTRAVENOUS

## 2020-05-26 MED ORDER — EPHEDRINE SULFATE 50 MG/ML IJ SOLN
INTRAMUSCULAR | Status: DC | PRN
  Administered 2020-05-26 (×4): 10 mg via INTRAVENOUS

## 2020-05-26 MED ORDER — KETOROLAC TROMETHAMINE 30 MG/ML IJ SOLN
30.0000 mg | Freq: Once | INTRAMUSCULAR | Status: AC
  Administered 2020-05-26: 30 mg via INTRAVENOUS

## 2020-05-26 MED ORDER — DEXAMETHASONE SODIUM PHOSPHATE 10 MG/ML IJ SOLN
INTRAMUSCULAR | Status: AC
  Filled 2020-05-26: qty 1

## 2020-05-26 MED ORDER — ORAL CARE MOUTH RINSE: 15.0000 mL | Freq: Once | OROMUCOSAL | Status: AC

## 2020-05-26 MED ORDER — MIDAZOLAM HCL 5 MG/5ML IJ SOLN
INTRAMUSCULAR | Status: DC | PRN
  Administered 2020-05-26: 2 mg via INTRAVENOUS

## 2020-05-26 MED ORDER — LACTATED RINGERS IV SOLN: INTRAVENOUS | Status: DC | PRN

## 2020-05-26 SURGICAL SUPPLY — 38 items
ADH SKN CLS APL DERMABOND .7 (GAUZE/BANDAGES/DRESSINGS) ×1
APL PRP STRL LF DISP 70% ISPRP (MISCELLANEOUS) ×1
BLADE SURG 15 STRL LF DISP TIS (BLADE) ×1 IMPLANT
BLADE SURG 15 STRL SS (BLADE) ×2
CHLORAPREP W/TINT 26 (MISCELLANEOUS) ×2 IMPLANT
CLOTH BEACON ORANGE TIMEOUT ST (SAFETY) ×2 IMPLANT
COVER LIGHT HANDLE STERIS (MISCELLANEOUS) ×4 IMPLANT
COVER WAND RF STERILE (DRAPES) ×2 IMPLANT
DERMABOND ADVANCED (GAUZE/BANDAGES/DRESSINGS) ×1
DERMABOND ADVANCED .7 DNX12 (GAUZE/BANDAGES/DRESSINGS) ×1 IMPLANT
DRSG TEGADERM 2-3/8X2-3/4 SM (GAUZE/BANDAGES/DRESSINGS) ×2 IMPLANT
ELECT REM PT RETURN 9FT ADLT (ELECTROSURGICAL) ×2
ELECTRODE REM PT RTRN 9FT ADLT (ELECTROSURGICAL) ×1 IMPLANT
GAUZE SPONGE 2X2 8PLY STRL LF (GAUZE/BANDAGES/DRESSINGS) ×1 IMPLANT
GLOVE BIO SURGEON STRL SZ 6.5 (GLOVE) ×2 IMPLANT
GLOVE BIOGEL PI IND STRL 6.5 (GLOVE) ×1 IMPLANT
GLOVE BIOGEL PI IND STRL 7.0 (GLOVE) ×1 IMPLANT
GLOVE BIOGEL PI INDICATOR 6.5 (GLOVE) ×1
GLOVE BIOGEL PI INDICATOR 7.0 (GLOVE) ×1
GLOVE ECLIPSE 7.0 STRL STRAW (GLOVE) ×2 IMPLANT
GOWN STRL REUS W/TWL LRG LVL3 (GOWN DISPOSABLE) ×4 IMPLANT
INST SET MINOR GENERAL (KITS) ×2 IMPLANT
KIT TURNOVER KIT A (KITS) ×2 IMPLANT
MANIFOLD NEPTUNE II (INSTRUMENTS) ×2 IMPLANT
MESH VENTRALEX ST 1-7/10 CRC S (Mesh General) ×2 IMPLANT
NEEDLE HYPO 18GX1.5 BLUNT FILL (NEEDLE) ×2 IMPLANT
NEEDLE HYPO 21X1.5 SAFETY (NEEDLE) ×2 IMPLANT
NS IRRIG 1000ML POUR BTL (IV SOLUTION) ×2 IMPLANT
PACK MINOR (CUSTOM PROCEDURE TRAY) ×2 IMPLANT
PAD ARMBOARD 7.5X6 YLW CONV (MISCELLANEOUS) ×2 IMPLANT
PENCIL SMOKE EVACUATOR (MISCELLANEOUS) ×2 IMPLANT
SET BASIN LINEN APH (SET/KITS/TRAYS/PACK) ×2 IMPLANT
SPONGE GAUZE 2X2 STER 10/PKG (GAUZE/BANDAGES/DRESSINGS) ×1
SUT ETHIBOND NAB MO 7 #0 18IN (SUTURE) ×2 IMPLANT
SUT MNCRL AB 4-0 PS2 18 (SUTURE) ×2 IMPLANT
SUT VIC AB 3-0 SH 27 (SUTURE) ×2
SUT VIC AB 3-0 SH 27X BRD (SUTURE) ×1 IMPLANT
SYR 20ML LL LF (SYRINGE) ×4 IMPLANT

## 2020-05-26 NOTE — Discharge Instructions (Signed)
Discharge Instructions Hernia:  Common Complaints: Pain at the incision site is common. This will improve with time. Take your pain medications as described below. Some nausea is common and poor appetite. The main goal is to stay hydrated the first few days after surgery.   Diet/ Activity: Diet as tolerated. You may not have an appetite, but it is important to stay hydrated. Drink 64 ounces of water a day. Your appetite will return with time.  Remove the small clear dressing and gauze after two days (48 hours). Trim the gauze off the glue that is underneath if the gauze is stuck to the glue. Shower per your regular routine daily.  Do not take hot showers. Take warm showers that are less than 10 minutes. Rest and listen to your body, but do not remain in bed all day.Walk everyday for at least 15-20 minutes.  Deep cough and move around every 1-2 hours in the first few days after surgery. Do not pick at the dermabond glue on your incision sites.  This glue film will remain in place for 1-2 weeks and will start to peel off. Do not place lotions or balms on your incision unless instructed to specifically by Dr. Henreitta Leber. Do not lift > 10 lbs, perform excessive bending, pushing, pulling, squatting for 6-8 weeks after surgery. Where your abdominal binder with activity as much as possible. The activity restrictions and the abdominal binder are to prevent hernia formation at your incision while you are healing.   Pain Expectations and Narcotics: -After surgery you will have pain associated with your incisions and this is normal. The pain is muscular and nerve pain, and will get better with time. -You are encouraged and expected to take non narcotic medications like tylenol and ibuprofen (when able) to treat pain as multiple modalities can aid with pain treatment. -Narcotics are only used when pain is severe or there is breakthrough pain. -You are not expected to have a pain score of 0 after surgery, as  we cannot prevent pain. A pain score of 3-4 that allows you to be functional, move, walk, and tolerate some activity is the goal. The pain will continue to improve over the days after surgery and is dependent on your surgery. -Due to [place] law, we are only able to give a certain amount of pain medication to treat post operative pain, and we only give narcotics on a patient by patient basis.  -For most laparoscopic surgery, studies have shown that the majority of patients only need 10-15 narcotic pills, and for open surgeries most patients only need 15-20.   -Having appropriate expectations of pain and knowledge of pain management with non narcotics is important as we do not want anyone to become addicted to narcotic pain medication.  -Using ice packs in the first 48 hours and heating pads after 48 hours, wearing an abdominal binder (when recommended), and using over the counter medications are all ways to help with pain management.   -Simple acts like meditation and mindfulness practices after surgery can also help with pain control and research has proven the benefit of these practices.  Medication: Take tylenol and ibuprofen as needed for pain control, alternating every 4-6 hours.  Example:  Tylenol 1000mg  @ 6am, 12noon, 6pm, (Do not exceed 4000mg  of tylenol a day). Ibuprofen 800mg  @ 9am, 3pm, 9pm, 3am (Do not exceed 3600mg  of ibuprofen a day).  Take Your Home Percocet for breakthrough pain as prescribed.   Take Colace for constipation related to  narcotic pain medication. If you do not have a bowel movement in 2 days, take Miralax over the counter.  Drink plenty of water to also prevent constipation.   Contact Information: If you have questions or concerns, please call our office, [phone], Monday- Thursday 8AM-5PM and Friday 8AM-12Noon.  If it is after hours or on the weekend, please call [facility], [phone], and ask to speak to the surgeon on call for Dr. Henreitta Leber at  [facility].    Open Hernia Repair, Adult, Care After What can I expect after the procedure? After the procedure, it is common to have:  Mild discomfort.  Slight bruising.  Mild swelling.  Pain in the belly (abdomen).  A small amount of blood from the cut from surgery (incision). Follow these instructions at home: Your doctor may give you more specific instructions. If you have problems, call your doctor. Medicines  Take over-the-counter and prescription medicines only as told by your doctor.  If told, take steps to prevent problems with pooping (constipation). You may need to: ? Drink enough fluid to keep your pee (urine) pale yellow. ? Take medicines. You will be told what medicines to take. ? Eat foods that are high in fiber. These include beans, whole grains, and fresh fruits and vegetables. ? Limit foods that are high in fat and sugar. These include fried or sweet foods.  Ask your doctor if you should avoid driving or using machines while you are taking your medicine. Incision care  Follow instructions from your doctor about how to take care of your incision. Make sure you: ? Wash your hands with soap and water for at least 20 seconds before and after you change your bandage (dressing). If you cannot use soap and water, use hand sanitizer. ? Change your bandage. ? Leave stitches or skin glue in place for at least 2 weeks. ? Leave tape strips alone unless you are told to take them off. You may trim the edges of the tape strips if they curl up.  Check your incision every day for signs of infection. Check for: ? More redness, swelling, or pain. ? More fluid or blood. ? Warmth. ? Pus or a bad smell.  Wear loose, soft clothing while your incision heals.   Activity  Rest as told by your doctor.  Do not lift anything that is heavier than 10 lb (4.5 kg), or the limit that you are told.  Do not play contact sports until your doctor says that this is safe.  If you were  given a sedative during your procedure, do not drive or use machines until your doctor says that it is safe. A sedative is a medicine that helps you relax.  Return to your normal activities when your doctor says that it is safe.   General instructions  Do not take baths, swim, or use a hot tub.   You may shower.  Hold a pillow over your belly when you cough or sneeze. This helps with pain.  Do not smoke or use any products that contain nicotine or tobacco. If you need help quitting, ask your doctor.  Keep all follow-up visits. Contact a doctor if:  You have any of these signs of infection in or around your incision: ? More redness, swelling, or pain. ? More fluid or blood. ? Warmth. ? Pus. ? A bad smell.  You have a fever or chills.  You have blood in your poop (stool).  You have not pooped (had a  bowel movement) in 2-3 days.  Medicine does not help your pain. Get help right away if:  You have chest pain, or you are short of breath.  You feel faint or light-headed.  You have very bad pain.  You vomit and your pain is worse.  You have pain, swelling, or redness in a leg. These symptoms may be an emergency. Get help right away. Call your local emergency services (911 in the [place]  Do not wait to see if the symptoms will go away.  Do not drive yourself to the hospital. Summary  After this procedure, it is common to have mild discomfort, slight bruising, and mild swelling.  Follow instructions from your doctor about how to take care of your cut from surgery (incision). Check every day for signs of infection.  Do not lift heavy objects or play contact sports until your doctor says it is safe.  Return to your normal activities as told by your doctor. This information is not intended to replace advice given to you by your health care provider. Make sure you discuss any questions you have with your health care provider. Document Revised: 12/01/2019 Document Reviewed:  12/01/2019 Elsevier Patient Education  2021 Elsevier Inc.        General Anesthesia, Adult, Care After This sheet gives you information about how to care for yourself after your procedure. Your health care provider may also give you more specific instructions. If you have problems or questions, contact your health care provider. What can I expect after the procedure? After the procedure, the following side effects are common:  Pain or discomfort at the IV site.  Nausea.  Vomiting.  Sore throat.  Trouble concentrating.  Feeling cold or chills.  Feeling weak or tired.  Sleepiness and fatigue.  Soreness and body aches. These side effects can affect parts of the body that were not involved in surgery. Follow these instructions at home: For the time period you were told by your health care provider:  Rest.  Do not participate in activities where you could fall or become injured.  Do not drive or use machinery.  Do not drink alcohol.  Do not take sleeping pills or medicines that cause drowsiness.  Do not make important decisions or sign legal documents.  Do not take care of children on your own.   Eating and drinking  Follow any instructions from your health care provider about eating or drinking restrictions.  When you feel hungry, start by eating small amounts of foods that are soft and easy to digest (bland), such as toast. Gradually return to your regular diet.  Drink enough fluid to keep your urine pale yellow.  If you vomit, rehydrate by drinking water, juice, or clear broth. General instructions  If you have sleep apnea, surgery and certain medicines can increase your risk for breathing problems. Follow instructions from your health care provider about wearing your sleep device: ? Anytime you are sleeping, including during daytime naps. ? While taking prescription pain medicines, sleeping medicines, or medicines that make you drowsy.  Have a responsible  adult stay with you for the time you are told. It is important to have someone help care for you until you are awake and alert.  Return to your normal activities as told by your health care provider. Ask your health care provider what activities are safe for you.  Take over-the-counter and prescription medicines only as told by your health care provider.  If you smoke, do not smoke  without supervision.  Keep all follow-up visits as told by your health care provider. This is important. Contact a health care provider if:  You have nausea or vomiting that does not get better with medicine.  You cannot eat or drink without vomiting.  You have pain that does not get better with medicine.  You are unable to pass urine.  You develop a skin rash.  You have a fever.  You have redness around your IV site that gets worse. Get help right away if:  You have difficulty breathing.  You have chest pain.  You have blood in your urine or stool, or you vomit blood. Summary  After the procedure, it is common to have a sore throat or nausea. It is also common to feel tired.  Have a responsible adult stay with you for the time you are told. It is important to have someone help care for you until you are awake and alert.  When you feel hungry, start by eating small amounts of foods that are soft and easy to digest (bland), such as toast. Gradually return to your regular diet.  Drink enough fluid to keep your urine pale yellow.  Return to your normal activities as told by your health care provider. Ask your health care provider what activities are safe for you. This information is not intended to replace advice given to you by your health care provider. Make sure you discuss any questions you have with your health care provider. Document Revised: 01/01/2020 Document Reviewed: 07/31/2019 Elsevier Patient Education  2021 Elsevier Inc.     [place] [facility] [place] [facility] [place] UNTIL Sunday  May 30, 2020.  DO NOT USE ANY ADDITIONAL NUMBING MEDICATIONS UNTIL AFTER Sunday   Bupivacaine Liposomal Suspension for Injection What is this medicine? BUPIVACAINE LIPOSOMAL (bue PIV a kane LIP oh som al) is an anesthetic. It causes loss of feeling in the skin or other tissues. It is used to prevent and to treat pain from some procedures. This medicine may be used for other purposes; ask your health care provider or pharmacist if you have questions. COMMON BRAND NAME(S): EXPAREL What should I tell my health care provider before I take this medicine? They need to know if you have any of these conditions:  G6PD deficiency  heart disease  kidney disease  liver disease  low blood pressure  lung or breathing disease, like asthma  an unusual or allergic reaction to bupivacaine, other medicines, foods, dyes, or preservatives  pregnant or trying to get pregnant  breast-feeding How should I use this medicine? This medicine is injected into the affected area. It is given by a health care provider in a hospital or clinic setting. Talk to your health care provider about the use of this medicine in children. While it may be given to children as young as 6 years for selected conditions, precautions do apply. Overdosage: If you think you have taken too much of this medicine contact a poison control center or emergency room at once. NOTE: This medicine is only for you. Do not share this medicine with others. What if I miss a dose? This does not apply. What may interact with this medicine? This medicine may interact with the following medications:  acetaminophen  certain antibiotics like dapsone, nitrofurantoin, aminosalicylic acid, sulfonamides  certain medicines for seizures like phenobarbital, phenytoin, valproic acid  chloroquine  cyclophosphamide  flutamide  hydroxyurea  ifosfamide  metoclopramide  nitric oxide  nitroglycerin  nitroprusside  nitrous oxide  other  local anesthetics like lidocaine, pramoxine, tetracaine  primaquine  quinine  rasburicase  sulfasalazine This list may not describe all possible interactions. Give your health care provider a list of all the medicines, herbs, non-prescription drugs, or dietary supplements you use. Also tell them if you smoke, drink alcohol, or use illegal drugs. Some items may interact with your medicine. What should I watch for while using this medicine? Your condition will be monitored carefully while you are receiving this medicine. Be careful to avoid injury while the area is numb, and you are not aware of pain. What side effects may I notice from receiving this medicine? Side effects that you should report to your doctor or health care professional as soon as possible:  allergic reactions like skin rash, itching or hives, swelling of the face, lips, or tongue  seizures  signs and symptoms of a dangerous change in heartbeat or heart rhythm like chest pain; dizziness; fast, irregular heartbeat; palpitations; feeling faint or lightheaded; falls; breathing problems  signs and symptoms of methemoglobinemia such as pale, gray, or blue colored skin; headache; fast heartbeat; shortness of breath; feeling faint or lightheaded, falls; tiredness Side effects that usually do not require medical attention (report to your doctor or health care professional if they continue or are bothersome):  anxious  back pain  changes in taste  changes in vision  constipation  dizziness  fever  nausea, vomiting This list may not describe all possible side effects. Call your doctor for medical advice about side effects. You may report side effects to FDA at 1-800-FDA-1088. Where should I keep my medicine? This drug is given in a hospital or clinic and will not be stored at home. NOTE: This sheet is a summary. It may not cover all possible information. If you have questions about this medicine, talk to your doctor,  pharmacist, or health care provider.  2021 Elsevier/Gold Standard (2019-07-24 12:24:57)

## 2020-05-26 NOTE — Progress Notes (Signed)
[facility]  Ms. Hambly notified that surgery completed. Patient on home percocet and will use this for pain.  Abdominal binder order. Zofran sent to pharmacy pending any post op nausea.   Will see in 4 weeks.   Algis Greenhouse, MD [facility] [address] [place], [place] [address] [phone] (office)

## 2020-05-26 NOTE — Interval H&P Note (Signed)
History and Physical Interval Note:  05/26/2020 8:20 AM  Noah Vasquez  has presented today for surgery, with the diagnosis of umbilical hernia.  The various methods of treatment have been discussed with the patient and family. After consideration of risks, benefits and other options for treatment, the patient has consented to  Procedure(s): HERNIA REPAIR UMBILICAL ADULT (N/A) as a surgical intervention.  The patient's history has been reviewed, patient examined, no change in status, stable for surgery.  I have reviewed the patient's chart and labs.  Questions were answered to the patient's satisfaction.     Lucretia Roers

## 2020-05-26 NOTE — Transfer of Care (Signed)
Immediate Anesthesia Transfer of Care Note  Patient: Noah Vasquez  Procedure(s) Performed: HERNIA REPAIR UMBILICAL ADULT (N/A Abdomen)  Patient Location: PACU  Anesthesia Type:General  Level of Consciousness: awake, alert , oriented and patient cooperative  Airway & Oxygen Therapy: Patient Spontanous Breathing  Post-op Assessment: Report given to RN and Post -op Vital signs reviewed and stable  Post vital signs: Reviewed and stable  Last Vitals:  Vitals Value Taken Time  BP    Temp    Pulse    Resp 20 05/26/20 0930  SpO2    Vitals shown include unvalidated device data.  Last Pain:  Vitals:   05/26/20 0717  TempSrc: Oral  PainSc: 2       Patients Stated Pain Goal: 6 (05/26/20 0717)  Complications: No complications documented.

## 2020-05-26 NOTE — Addendum Note (Signed)
Addendum  created 05/26/20 0935 by Jeani Hawking, CRNA   Charge Capture section accepted, Visit diagnoses modified

## 2020-05-26 NOTE — Anesthesia Postprocedure Evaluation (Signed)
Anesthesia Post Note  Patient: Noah Vasquez  Procedure(s) Performed: HERNIA REPAIR UMBILICAL ADULT (N/A Abdomen)  Patient location during evaluation: PACU Anesthesia Type: General Level of consciousness: awake and alert, oriented and patient cooperative Pain management: pain level controlled Vital Signs Assessment: post-procedure vital signs reviewed and stable Respiratory status: spontaneous breathing and respiratory function stable Cardiovascular status: blood pressure returned to baseline and stable Postop Assessment: no apparent nausea or vomiting and no headache Anesthetic complications: no   No complications documented.   Last Vitals:  Vitals:   05/26/20 0717  BP: (!) 133/101  Pulse: 88  Resp: 16  Temp: 36.8 C  SpO2: 99%    Last Pain:  Vitals:   05/26/20 0717  TempSrc: Oral  PainSc: 2                  Julionna Marczak

## 2020-05-26 NOTE — Anesthesia Preprocedure Evaluation (Signed)
Anesthesia Evaluation  Patient identified by MRN, date of birth, ID band Patient awake    Reviewed: Allergy & Precautions, H&P , NPO status , Patient's Chart, lab work & pertinent test results, reviewed documented beta blocker date and time   Airway Mallampati: II  TM Distance: >3 FB Neck ROM: full    Dental no notable dental hx. (+) Teeth Intact   Pulmonary neg pulmonary ROS, Current Smoker and Patient abstained from smoking.,    Pulmonary exam normal breath sounds clear to auscultation       Cardiovascular Exercise Tolerance: Good hypertension, negative cardio ROS   Rhythm:regular Rate:Normal     Neuro/Psych  Headaches, PSYCHIATRIC DISORDERS Anxiety Depression  Neuromuscular disease negative neurological ROS  negative psych ROS   GI/Hepatic Neg liver ROS, GERD  Medicated,  Endo/Other  negative endocrine ROS  Renal/GU negative Renal ROS  negative genitourinary   Musculoskeletal negative musculoskeletal ROS (+)   Abdominal   Peds negative pediatric ROS (+)  Hematology negative hematology ROS (+)   Anesthesia Other Findings   Reproductive/Obstetrics negative OB ROS                             Anesthesia Physical  Anesthesia Plan  ASA: II  Anesthesia Plan: General   Post-op Pain Management:    Induction:   PONV Risk Score and Plan: 2 and Ondansetron  Airway Management Planned:   Additional Equipment:   Intra-op Plan:   Post-operative Plan:   Informed Consent: I have reviewed the patients History and Physical, chart, labs and discussed the procedure including the risks, benefits and alternatives for the proposed anesthesia with the patient or authorized representative who has indicated his/her understanding and acceptance.     Dental Advisory Given  Plan Discussed with: CRNA  Anesthesia Plan Comments:         Anesthesia Quick Evaluation

## 2020-05-26 NOTE — Op Note (Signed)
[facility] Operative Note  05/26/20  Preoperative Diagnosis: Umbilical hernia    Postoperative Diagnosis: Same   Procedure(s) Performed:  Umbilical hernia repair with mesh (4.3cm Ventralex St Hernia Patch)    Surgeon: Leatrice Jewels. Henreitta Leber, MD   Assistants: No qualified resident was available    Anesthesia: General endotracheal   Anesthesiologist: Windell Norfolk, MD    Specimens: None    Estimated Blood Loss: Minimal   Blood Replacement: None    Complications: None   Wound Class: Clean    Operative Indications: Mr. Strauch is a 40 yo with a umbilical hernia that is causing him discomfort. We discussed the risk of repair including the use of mesh, recurrence, bleeding, infection and injury to other organs.   Findings: 1cm umbilical hernia with fat    Procedure: The patient was taken to the operating room and placed supine. General endotracheal anesthesia was induced. Intravenous antibiotics were  administered per protocol.  The abdomen was prepared and draped in the usual sterile fashion.   The umbilical hernia was noted to be reducible and measured about 1cm. An incision was made under the umbilicus, and carried down through the subcutaneous tissue with electrocautery.  Dissection was performed down to the level of the fascia, exposing the hernia sac which was quite fatty.  The hernia sac was opened with care, and excess hernia sac was resected with electrocautery.  A finger was ran on the underlying peritoneum and this was clear.  A 4.3 cm Ventralex St Hernia Patch was placed and secured with 0 Ethibond sutures ensuring that it was against the peritoneal cavity.  The hernia defect was then closed with 0 Ethibond suture in an interrupted fashion over the patch.  The umbilicus was tacked to the fascia with a 3-0 Vicryl suture.   Hemostasis was confirmed. The skin was closed with a running 4-0 Monocryl suture and dermabond.  After the dermabond dried a 2X2 and  tegaderm were placed over the umbilicus to act as a pressure dressing.    All counts were correct at the end of the case. The patient was awakened from anesthesia and extubated without complication.  The patient went to the PACU in stable condition.  Algis Greenhouse, MD [facility] [address] [place] [place], [place] [address] [phone] (office)

## 2020-05-28 ENCOUNTER — Encounter (HOSPITAL_COMMUNITY): Payer: Self-pay | Admitting: General Surgery

## 2020-06-09 ENCOUNTER — Ambulatory Visit
Admission: EM | Admit: 2020-06-09 | Discharge: 2020-06-09 | Disposition: A | Discharge status: Home / Self Care | Payer: Medicaid Other | Attending: Family Medicine | Admitting: Family Medicine

## 2020-06-09 ENCOUNTER — Other Ambulatory Visit: Payer: Self-pay

## 2020-06-09 DIAGNOSIS — Z8616 Personal history of COVID-19: Secondary | ICD-10-CM

## 2020-06-09 DIAGNOSIS — Z1152 Encounter for screening for COVID-19: Secondary | ICD-10-CM

## 2020-06-09 HISTORY — DX: Personal history of COVID-19: Z86.16

## 2020-06-10 LAB — SARS-COV-2, NAA 2 DAY TAT

## 2020-06-10 LAB — NOVEL CORONAVIRUS, NAA: SARS-CoV-2, NAA: DETECTED — AB

## 2020-06-11 ENCOUNTER — Telehealth: Payer: Self-pay | Admitting: Physician Assistant

## 2020-06-11 NOTE — Telephone Encounter (Signed)
Called to discuss with patient about COVID-19 symptoms and the use of one of the available treatments for those with mild to moderate Covid symptoms and at a high risk of hospitalization.  Pt appears to qualify for outpatient treatment due to co-morbid conditions and/or a member of an at-risk group in accordance with the FDA Emergency Use Authorization.    Symptom onset: unknown, tested 2/9 Vaccinated: unknown Booster? unknown Immunocompromised? no Qualifiers: BMI 33, HTN, asthma, high SVI   Unable to reach pt - left VM and mychart msg   [employer]

## 2020-06-17 ENCOUNTER — Other Ambulatory Visit: Payer: Self-pay

## 2020-06-17 ENCOUNTER — Encounter: Payer: Self-pay | Admitting: General Surgery

## 2020-06-17 ENCOUNTER — Ambulatory Visit (INDEPENDENT_AMBULATORY_CARE_PROVIDER_SITE_OTHER): Payer: Self-pay | Admitting: General Surgery

## 2020-06-17 VITALS — BP 147/97 | HR 95 | Temp 97.5°F | Resp 14 | Ht 69.0 in | Wt 243.0 lb

## 2020-06-17 DIAGNOSIS — K429 Umbilical hernia without obstruction or gangrene: Secondary | ICD-10-CM

## 2020-06-17 NOTE — Progress Notes (Signed)
[facility] Note   HPI:  40 y.o. Male presents to clinic for post-op follow-up evaluation of his umbilical hernia repair. Patient reports doing well and having minor soreness.  Review of Systems:  No redness or drainage All other review of systems: otherwise negative   Vital Signs:  BP (!) 147/97   Pulse 95   Temp (!) 97.5 F (36.4 C) (Other (Comment))   Resp 14   Ht 5\' 9"  (1.753 m)   Wt 243 lb (110.2 kg)   SpO2 92%   BMI 35.88 kg/m    Physical Exam:  Physical Exam Vitals reviewed.  Cardiovascular:     Rate and Rhythm: Normal rate.  Pulmonary:     Effort: Pulmonary effort is normal.  Abdominal:     General: There is no distension.     Palpations: Abdomen is soft.     Tenderness: There is no abdominal tenderness.     Hernia: No hernia is present.     Comments: Healed infraumbilical incision  Neurological:     Mental Status: He is alert.      Assessment:  40 y.o. yo Male with healing umbilical hernia repair.  Plan:  No heavy lifting > 10 lbs, excessive bending, pushing, pulling, or squatting for 6-8 weeks after surgery.  Then gradually increase activity after that time.  Follow up PRN    24, MD [facility] [address] [place], [place] [phone] (office)

## 2020-06-17 NOTE — Patient Instructions (Signed)
No heavy lifting > 10 lbs, excessive bending, pushing, pulling, or squatting for 6-8 weeks after surgery.  Then gradually increase activity after that time.

## 2020-11-26 ENCOUNTER — Other Ambulatory Visit: Payer: Self-pay | Admitting: Neurosurgery

## 2020-12-01 ENCOUNTER — Other Ambulatory Visit: Payer: Self-pay

## 2020-12-01 ENCOUNTER — Encounter
Admission: RE | Admit: 2020-12-01 | Discharge: 2020-12-01 | Disposition: A | Discharge status: Home / Self Care | Payer: Medicaid Other | Source: Ambulatory Visit | Attending: Neurosurgery | Admitting: Neurosurgery

## 2020-12-01 ENCOUNTER — Encounter: Payer: Self-pay | Admitting: Neurosurgery

## 2020-12-01 DIAGNOSIS — Z01812 Encounter for preprocedural laboratory examination: Secondary | ICD-10-CM | POA: Diagnosis present

## 2020-12-01 LAB — SURGICAL PCR SCREEN
MRSA, PCR: NEGATIVE
Staphylococcus aureus: POSITIVE — AB

## 2020-12-01 LAB — BASIC METABOLIC PANEL
Anion gap: 8 (ref 5–15)
BUN: 11 mg/dL (ref 6–20)
CO2: 27 mmol/L (ref 22–32)
Calcium: 9.2 mg/dL (ref 8.9–10.3)
Chloride: 103 mmol/L (ref 98–111)
Creatinine, Ser: 1.04 mg/dL (ref 0.61–1.24)
GFR, Estimated: >60 mL/min (ref >60)
Glucose, Bld: 91 mg/dL (ref 70–99)
Potassium: 4 mmol/L (ref 3.5–5.1)
Sodium: 138 mmol/L (ref 135–145)

## 2020-12-01 LAB — CBC
HCT: 44 % (ref 39.0–52.0)
Hemoglobin: 15 g/dL (ref 13.0–17.0)
MCH: 29.5 pg (ref 26.0–34.0)
MCHC: 34.1 g/dL (ref 30.0–36.0)
MCV: 86.6 fL (ref 80.0–100.0)
Platelets: 216 10*3/uL (ref 150–400)
RBC: 5.08 MIL/uL (ref 4.22–5.81)
RDW: 13.4 % (ref 11.5–15.5)
WBC: 6.3 10*3/uL (ref 4.0–10.5)
nRBC: 0 % (ref 0.0–0.2)

## 2020-12-01 LAB — TYPE AND SCREEN
ABO/RH(D): O POS
Antibody Screen: NEGATIVE

## 2020-12-01 NOTE — Patient Instructions (Addendum)
Your procedure is scheduled on:12/08/2020 Report to the Registration Desk on the 1st floor of the [facility]. To find out your arrival time, please call [phone] between 1PM - 3PM on: 12/07/2020   REMEMBER: Instructions that are not followed completely may result in serious medical risk, up to and including death; or upon the discretion of your surgeon and anesthesiologist your surgery may need to be rescheduled.  Do not eat food after midnight the night before surgery.  No gum chewing, lozengers or hard candies.  You may however, drink CLEAR liquids up to 2 hours before you are scheduled to arrive for your surgery. Do not drink anything within 2 hours of your scheduled arrival time.  Clear liquids include: - water  - apple juice without pulp - gatorade (not RED, PURPLE, OR BLUE) - black coffee or tea (Do NOT add milk or creamers to the coffee or tea) Do NOT drink anything that is not on this list.    TAKE THESE MEDICATIONS THE MORNING OF SURGERY WITH A SIP OF WATER:  FLUoxetine (PROZAC     One week prior to surgery: Stop Anti-inflammatories (NSAIDS) such as Advil, Aleve, Ibuprofen, Motrin, Naproxen, Naprosyn and Aspirin based products such as Excedrin, Goodys Powder, BC Powder   Stop ANY OVER THE COUNTER supplements until after surgery. You may however, continue to take Tylenol if needed for pain up until the day of surgery.  No Alcohol for 24 hours before or after surgery.  No Smoking including e-cigarettes for 24 hours prior to surgery.  No chewable tobacco products for at least 6 hours prior to surgery.  No nicotine patches on the day of surgery.  Do not use any "recreational" drugs for at least a week prior to your surgery.  Please be advised that the combination of cocaine and anesthesia may have negative outcomes, up to and including death. If you test positive for cocaine, your surgery will be cancelled.  On the morning of surgery brush your teeth with  toothpaste and water, you may rinse your mouth with mouthwash if you wish. Do not swallow any toothpaste or mouthwash.  Do not wear jewelry.  Do not wear lotions, powders, or perfumes.   Do not shave body from the neck down 48 hours prior to surgery just in case you cut yourself which could leave a site for infection.  Also, freshly shaved skin may become irritated if using the CHG soap.  Contact lenses, hearing aids and dentures may not be worn into surgery.  Do not bring valuables to the hospital. [facility] is not responsible for any missing/lost belongings or valuables.   Use CHG Soap or wipes as directed on instruction sheet.  Notify your doctor if there is any change in your medical condition (cold, fever, infection).  Wear comfortable clothing (specific to your surgery type) to the hospital.  After surgery, you can help prevent lung complications by doing breathing exercises.  Take deep breaths and cough every 1-2 hours. Your doctor may order a device called an Incentive Spirometer to help you take deep breaths.  If you are being admitted to the hospital overnight, leave your suitcase in the car. After surgery it may be brought to your room.  If you are being discharged the day of surgery, you will not be allowed to drive home. You will need a responsible adult (18 years or older) to drive you home and stay with you that night.   If you are taking public transportation, you  will need to have a responsible adult (18 years or older) with you. Please confirm with your physician that it is acceptable to use public transportation.   Please call the Pre-admissions Testing Dept. at [phone] if you have any questions about these instructions.  Surgery Visitation Policy:  Patients undergoing a surgery or procedure may have one family member or support person with them as long as that person is not COVID-19 positive or experiencing its symptoms.  That person may remain in the  waiting area during the procedure.  Inpatient Visitation:    Visiting hours are 7 a.m. to 8 p.m. Inpatients will be allowed two visitors daily. The visitors may change each day during the patient's stay. No visitors under the age of 28. Any visitor under the age of 57 must be accompanied by an adult. The visitor must pass COVID-19 screenings, use hand sanitizer when entering and exiting the patient's room and wear a mask at all times, including in the patient's room. Patients must also wear a mask when staff or their visitor are in the room. Masking is required regardless of vaccination status.

## 2020-12-08 ENCOUNTER — Ambulatory Visit: Payer: Medicaid Other | Admitting: Urgent Care

## 2020-12-08 ENCOUNTER — Encounter
Admission: RE | Disposition: A | Discharge status: Home / Self Care | Payer: Self-pay | Source: Home / Self Care | Attending: Neurosurgery

## 2020-12-08 ENCOUNTER — Encounter: Payer: Self-pay | Admitting: Neurosurgery

## 2020-12-08 ENCOUNTER — Ambulatory Visit
Admission: RE | Admit: 2020-12-08 | Discharge: 2020-12-08 | Disposition: A | Discharge status: Home / Self Care | Payer: Medicaid Other | Attending: Neurosurgery | Admitting: Neurosurgery

## 2020-12-08 ENCOUNTER — Other Ambulatory Visit: Payer: Self-pay

## 2020-12-08 DIAGNOSIS — Z91030 Bee allergy status: Secondary | ICD-10-CM | POA: Diagnosis not present

## 2020-12-08 DIAGNOSIS — Z79899 Other long term (current) drug therapy: Secondary | ICD-10-CM | POA: Diagnosis not present

## 2020-12-08 DIAGNOSIS — Z888 Allergy status to other drugs, medicaments and biological substances status: Secondary | ICD-10-CM | POA: Insufficient documentation

## 2020-12-08 DIAGNOSIS — F17210 Nicotine dependence, cigarettes, uncomplicated: Secondary | ICD-10-CM | POA: Diagnosis not present

## 2020-12-08 DIAGNOSIS — Z4549 Encounter for adjustment and management of other implanted nervous system device: Secondary | ICD-10-CM | POA: Diagnosis not present

## 2020-12-08 HISTORY — PX: SPINAL CORD STIMULATOR BATTERY EXCHANGE: SHX6202

## 2020-12-08 SURGERY — SPINAL CORD STIMULATOR BATTERY EXCHANGE
Anesthesia: General

## 2020-12-08 MED ORDER — PROPOFOL 10 MG/ML IV BOLUS
INTRAVENOUS | Status: DC | PRN
  Administered 2020-12-08: 50 mg via INTRAVENOUS
  Administered 2020-12-08: 200 mg via INTRAVENOUS

## 2020-12-08 MED ORDER — DEXAMETHASONE SODIUM PHOSPHATE 10 MG/ML IJ SOLN
INTRAMUSCULAR | Status: DC | PRN
  Administered 2020-12-08: 10 mg via INTRAVENOUS

## 2020-12-08 MED ORDER — CEFAZOLIN SODIUM-DEXTROSE 2-4 GM/100ML-% IV SOLN
2.0000 g | INTRAVENOUS | Status: AC
  Administered 2020-12-08: 2 g via INTRAVENOUS

## 2020-12-08 MED ORDER — OXYCODONE HCL 5 MG PO TABS
5.0000 mg | ORAL_TABLET | Freq: Once | ORAL | Status: AC | PRN
  Administered 2020-12-08: 5 mg via ORAL

## 2020-12-08 MED ORDER — ONDANSETRON HCL 4 MG/2ML IJ SOLN: 4.0000 mg | Freq: Once | INTRAMUSCULAR | Status: DC | PRN

## 2020-12-08 MED ORDER — LIDOCAINE HCL (CARDIAC) PF 100 MG/5ML IV SOSY
PREFILLED_SYRINGE | INTRAVENOUS | Status: DC | PRN
  Administered 2020-12-08: 100 mg via INTRAVENOUS

## 2020-12-08 MED ORDER — METOCLOPRAMIDE HCL 5 MG/ML IJ SOLN
INTRAMUSCULAR | Status: AC
  Administered 2020-12-08: 10 mg via INTRAVENOUS
  Filled 2020-12-08: qty 2

## 2020-12-08 MED ORDER — CHLORHEXIDINE GLUCONATE 0.12 % MT SOLN
OROMUCOSAL | Status: AC
  Administered 2020-12-08: 15 mL via OROMUCOSAL
  Filled 2020-12-08: qty 15

## 2020-12-08 MED ORDER — VANCOMYCIN HCL 1500 MG/300ML IV SOLN
1500.0000 mg | INTRAVENOUS | Status: AC
  Administered 2020-12-08 (×2): 1500 mg via INTRAVENOUS
  Filled 2020-12-08: qty 300

## 2020-12-08 MED ORDER — MIDAZOLAM HCL 2 MG/2ML IJ SOLN
INTRAMUSCULAR | Status: AC
  Filled 2020-12-08: qty 2

## 2020-12-08 MED ORDER — BUPIVACAINE-EPINEPHRINE (PF) 0.5% -1:200000 IJ SOLN
INTRAMUSCULAR | Status: DC | PRN
  Administered 2020-12-08: 8 mL

## 2020-12-08 MED ORDER — PROPOFOL 1000 MG/100ML IV EMUL
INTRAVENOUS | Status: AC
  Filled 2020-12-08: qty 300

## 2020-12-08 MED ORDER — DEXMEDETOMIDINE (PRECEDEX) IN NS 20 MCG/5ML (4 MCG/ML) IV SYRINGE
PREFILLED_SYRINGE | INTRAVENOUS | Status: DC | PRN
  Administered 2020-12-08 (×2): 8 ug via INTRAVENOUS
  Administered 2020-12-08: 12 ug via INTRAVENOUS

## 2020-12-08 MED ORDER — 0.9 % SODIUM CHLORIDE (POUR BTL) OPTIME
TOPICAL | Status: DC | PRN
  Administered 2020-12-08: 1000 mL

## 2020-12-08 MED ORDER — FENTANYL CITRATE (PF) 100 MCG/2ML IJ SOLN
INTRAMUSCULAR | Status: DC | PRN
  Administered 2020-12-08: 100 ug via INTRAVENOUS

## 2020-12-08 MED ORDER — METOCLOPRAMIDE HCL 5 MG/ML IJ SOLN: 10.0000 mg | Freq: Once | INTRAMUSCULAR | Status: AC

## 2020-12-08 MED ORDER — PROPOFOL 10 MG/ML IV BOLUS
INTRAVENOUS | Status: AC
  Filled 2020-12-08: qty 40

## 2020-12-08 MED ORDER — FAMOTIDINE IN NACL 20-0.9 MG/50ML-% IV SOLN
20.0000 mg | Freq: Once | INTRAVENOUS | Status: DC
  Filled 2020-12-08: qty 50

## 2020-12-08 MED ORDER — CEFAZOLIN SODIUM-DEXTROSE 2-4 GM/100ML-% IV SOLN
INTRAVENOUS | Status: AC
  Filled 2020-12-08: qty 100

## 2020-12-08 MED ORDER — ONDANSETRON HCL 4 MG/2ML IJ SOLN
INTRAMUSCULAR | Status: DC | PRN
  Administered 2020-12-08: 4 mg via INTRAVENOUS

## 2020-12-08 MED ORDER — FAMOTIDINE 20 MG PO TABS: 20.0000 mg | ORAL_TABLET | Freq: Once | ORAL | Status: AC

## 2020-12-08 MED ORDER — REMIFENTANIL HCL 1 MG IV SOLR
INTRAVENOUS | Status: AC
  Filled 2020-12-08: qty 1000

## 2020-12-08 MED ORDER — OXYCODONE HCL 5 MG/5ML PO SOLN: 5.0000 mg | Freq: Once | ORAL | Status: AC | PRN

## 2020-12-08 MED ORDER — ACETAMINOPHEN 10 MG/ML IV SOLN
INTRAVENOUS | Status: DC | PRN
  Administered 2020-12-08: 1000 mg via INTRAVENOUS

## 2020-12-08 MED ORDER — FENTANYL CITRATE (PF) 100 MCG/2ML IJ SOLN
INTRAMUSCULAR | Status: AC
  Filled 2020-12-08: qty 2

## 2020-12-08 MED ORDER — FAMOTIDINE 20 MG PO TABS
ORAL_TABLET | ORAL | Status: AC
  Administered 2020-12-08: 20 mg via ORAL
  Filled 2020-12-08: qty 1

## 2020-12-08 MED ORDER — GLYCOPYRROLATE 0.2 MG/ML IJ SOLN
INTRAMUSCULAR | Status: DC | PRN
  Administered 2020-12-08: .2 mg via INTRAVENOUS

## 2020-12-08 MED ORDER — OXYCODONE HCL 5 MG PO TABS: 5.0000 mg | ORAL_TABLET | Freq: Three times a day (TID) | ORAL | 0 refills | Status: AC | PRN

## 2020-12-08 MED ORDER — LACTATED RINGERS IV SOLN: INTRAVENOUS | Status: DC

## 2020-12-08 MED ORDER — SUCCINYLCHOLINE CHLORIDE 200 MG/10ML IV SOSY
PREFILLED_SYRINGE | INTRAVENOUS | Status: DC | PRN
  Administered 2020-12-08: 120 mg via INTRAVENOUS

## 2020-12-08 MED ORDER — FENTANYL CITRATE (PF) 100 MCG/2ML IJ SOLN: 25.0000 ug | INTRAMUSCULAR | Status: DC | PRN

## 2020-12-08 MED ORDER — ALBUTEROL SULFATE HFA 108 (90 BASE) MCG/ACT IN AERS
INHALATION_SPRAY | RESPIRATORY_TRACT | Status: DC | PRN
  Administered 2020-12-08 (×3): 4 via RESPIRATORY_TRACT

## 2020-12-08 MED ORDER — SEVOFLURANE IN SOLN
RESPIRATORY_TRACT | Status: AC
  Filled 2020-12-08: qty 250

## 2020-12-08 MED ORDER — PHENYLEPHRINE HCL (PRESSORS) 10 MG/ML IV SOLN
INTRAVENOUS | Status: DC | PRN
  Administered 2020-12-08: 200 ug via INTRAVENOUS

## 2020-12-08 MED ORDER — OXYCODONE HCL 5 MG PO TABS
ORAL_TABLET | ORAL | Status: AC
  Filled 2020-12-08: qty 1

## 2020-12-08 MED ORDER — CHLORHEXIDINE GLUCONATE 0.12 % MT SOLN: 15.0000 mL | Freq: Once | OROMUCOSAL | Status: AC

## 2020-12-08 MED ORDER — ORAL CARE MOUTH RINSE: 15.0000 mL | Freq: Once | OROMUCOSAL | Status: AC

## 2020-12-08 MED ORDER — MEPERIDINE HCL 25 MG/ML IJ SOLN: 6.2500 mg | INTRAMUSCULAR | Status: DC | PRN

## 2020-12-08 MED ORDER — MIDAZOLAM HCL 2 MG/2ML IJ SOLN
INTRAMUSCULAR | Status: DC | PRN
  Administered 2020-12-08: 2 mg via INTRAVENOUS

## 2020-12-08 MED ORDER — ACETAMINOPHEN 10 MG/ML IV SOLN
INTRAVENOUS | Status: AC
  Filled 2020-12-08: qty 100

## 2020-12-08 SURGICAL SUPPLY — 51 items
BUR NEURO DRILL SOFT 3.0X3.8M (BURR) ×2 IMPLANT
CHLORAPREP W/TINT 26 (MISCELLANEOUS) ×4 IMPLANT
CNTNR SPEC 2.5X3XGRAD LEK (MISCELLANEOUS) ×1
CONT SPEC 4OZ STER OR WHT (MISCELLANEOUS) ×1
CONT SPEC 4OZ STRL OR WHT (MISCELLANEOUS) ×1
CONTAINER SPEC 2.5X3XGRAD LEK (MISCELLANEOUS) ×1 IMPLANT
COUNTER NEEDLE 20/40 LG (NEEDLE) ×2 IMPLANT
DERMABOND ADVANCED (GAUZE/BANDAGES/DRESSINGS) ×1
DERMABOND ADVANCED .7 DNX12 (GAUZE/BANDAGES/DRESSINGS) ×1 IMPLANT
DEVICE IMPLANT NEUROSTIMULATOR (Neuro Prosthesis/Implant) ×2 IMPLANT
DRAPE C ARM PK CFD 31 SPINE (DRAPES) ×2 IMPLANT
DRAPE LAPAROTOMY 100X77 ABD (DRAPES) ×2 IMPLANT
DRAPE SURG 17X11 SM STRL (DRAPES) ×8 IMPLANT
ELECT CAUTERY BLADE TIP 2.5 (TIP) ×2
ELECT EZSTD 165MM 6.5IN (MISCELLANEOUS) ×2
ELECT REM PT RETURN 9FT ADLT (ELECTROSURGICAL) ×2
ELECTRODE CAUTERY BLDE TIP 2.5 (TIP) ×1 IMPLANT
ELECTRODE EZSTD 165MM 6.5IN (MISCELLANEOUS) ×1 IMPLANT
ELECTRODE REM PT RTRN 9FT ADLT (ELECTROSURGICAL) ×1 IMPLANT
GAUZE 4X4 16PLY ~~LOC~~+RFID DBL (SPONGE) ×2 IMPLANT
GLOVE SURG SYN 6.5 ES PF (GLOVE) ×2 IMPLANT
GLOVE SURG SYN 8.5  E (GLOVE) ×6
GLOVE SURG SYN 8.5 E (GLOVE) ×3 IMPLANT
GLOVE SURG UNDER POLY LF SZ6.5 (GLOVE) ×2 IMPLANT
GOWN SRG LRG LVL 4 IMPRV REINF (GOWNS) ×1 IMPLANT
GOWN SRG XL LVL 3 NONREINFORCE (GOWNS) ×1 IMPLANT
GOWN STRL NON-REIN TWL XL LVL3 (GOWNS) ×2
GOWN STRL REIN LRG LVL4 (GOWNS) ×2
GOWN STRL REUS W/ TWL XL LVL3 (GOWN DISPOSABLE) ×1 IMPLANT
GOWN STRL REUS W/TWL XL LVL3 (GOWN DISPOSABLE) ×2
GRADUATE 1200CC STRL 31836 (MISCELLANEOUS) ×2 IMPLANT
KIT SPINAL PRONEVIEW (KITS) ×2 IMPLANT
MANIFOLD NEPTUNE II (INSTRUMENTS) ×2 IMPLANT
MARKER SKIN DUAL TIP RULER LAB (MISCELLANEOUS) ×2 IMPLANT
NDL SAFETY ECLIPSE 18X1.5 (NEEDLE) ×2 IMPLANT
NEEDLE HYPO 18GX1.5 SHARP (NEEDLE) ×4
NEEDLE HYPO 22GX1.5 SAFETY (NEEDLE) ×2 IMPLANT
NS IRRIG 1000ML POUR BTL (IV SOLUTION) ×2 IMPLANT
PACK LAMINECTOMY NEURO (CUSTOM PROCEDURE TRAY) ×2 IMPLANT
PAD ARMBOARD 7.5X6 YLW CONV (MISCELLANEOUS) ×2 IMPLANT
RECHARGER INTELLIS (NEUROSURGERY SUPPLIES) ×2 IMPLANT
REPROGRAMMER INTELLIS (NEUROSURGERY SUPPLIES) ×2 IMPLANT
SPOGE SURGIFLO 8M (HEMOSTASIS)
SPONGE SURGIFLO 8M (HEMOSTASIS) IMPLANT
SUT DVC VLOC 3-0 CL 6 P-12 (SUTURE) ×2 IMPLANT
SUT VIC AB 0 CT1 27 (SUTURE) ×2
SUT VIC AB 0 CT1 27XCR 8 STRN (SUTURE) ×1 IMPLANT
SUT VIC AB 2-0 CT1 18 (SUTURE) ×2 IMPLANT
SYR 30ML LL (SYRINGE) ×2 IMPLANT
TOWEL OR 17X26 4PK STRL BLUE (TOWEL DISPOSABLE) ×6 IMPLANT
TUBING CONNECTING 10 (TUBING) ×2 IMPLANT

## 2020-12-08 NOTE — Anesthesia Preprocedure Evaluation (Signed)
Anesthesia Evaluation  Patient identified by MRN, date of birth, ID band Patient awake    Reviewed: Allergy & Precautions, NPO status , Patient's Chart, lab work & pertinent test results  History of Anesthesia Complications Negative for: history of anesthetic complications  Airway Mallampati: I  TM Distance: >3 FB     Dental  (+) Edentulous Upper, Edentulous Lower   Pulmonary Current SmokerPatient did not abstain from smoking.,    Pulmonary exam normal        Cardiovascular hypertension, Pt. on medications Normal cardiovascular examI     Neuro/Psych    GI/Hepatic GERD  Poorly Controlled,  Endo/Other    Renal/GU      Musculoskeletal   Abdominal Normal abdominal exam  (+)   Peds  Hematology   Anesthesia Other Findings   Reproductive/Obstetrics                             Anesthesia Physical Anesthesia Plan  ASA: 2  Anesthesia Plan: General   Post-op Pain Management:    Induction: Intravenous  PONV Risk Score and Plan:   Airway Management Planned: Simple Face Mask  Additional Equipment:   Intra-op Plan:   Post-operative Plan:   Informed Consent: I have reviewed the patients History and Physical, chart, labs and discussed the procedure including the risks, benefits and alternatives for the proposed anesthesia with the patient or authorized representative who has indicated his/her understanding and acceptance.       Plan Discussed with: CRNA  Anesthesia Plan Comments: (Giving pepcid and reglan)        Anesthesia Quick Evaluation

## 2020-12-08 NOTE — Discharge Instructions (Addendum)
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 Increase protein intake to promote wound healing.  Recommended activity: activity as tolerated. No driving for 2 weeks or while taking narcotics. You should walk multiple times per day  Special Instructions  No straining, no heavy lifting > 10lbs x 4 weeks.  Keep incision area clean and dry. May shower tomorrow. No baths or pools for 6 weeks.   You have no sutures to remove, the skin is closed with adhesive. This should come off on its own in 10-14 days.  Please take pain medications as directed. Take a stool softener if on pain medications   Please Report any of the following: Nausea or Vomiting, Temperature is greater than 101.60F (38.1C) degrees, Dizziness, Abdominal Pain, Difficulty Breathing or Shortness of Breath, Inability to Eat, drink Fluids, or Take medications, Bleeding, swelling, or drainage from surgical incision sites, New numbness or weakness, and Bowel or bladder dysfunction to the neurosurgeon on call at [phone]  Additional Follow up appointments Please follow up with Manning Charity in [place] clinic as scheduled in 2-3 weeks for incision check   Please see below for scheduled appointments:  No future appointments.  AMBULATORY SURGERY  DISCHARGE INSTRUCTIONS   The drugs that you were given will stay in your system until tomorrow so for the next 24 hours you should not:  Drive an automobile Make any legal decisions Drink any alcoholic beverage   You may resume regular meals tomorrow.  Today it is better to start with liquids and gradually work up to solid foods.  You may eat anything you prefer, but it is better to start with liquids, then soup and crackers, and gradually work up to solid  foods.   Please notify your doctor immediately if you have any unusual bleeding, trouble breathing, redness and pain at the surgery site, drainage, fever, or pain not relieved by medication.    Additional Instructions:        Please contact your physician with any problems or Same Day Surgery at [phone], Monday through Friday 6 am to 4 pm, or [place] at [facility] number at [phone].

## 2020-12-08 NOTE — Op Note (Signed)
Indications: the patient is a 40 yo male who was diagnosed with battery end of life of spinal cord stimulator. Due to battery end of life, replacement was recommended.   Findings: successful placement of a [employer] internal pulse generator model number [id number]   Preoperative Diagnosis: Battery of spinal cord stimulator end of life Postoperative Diagnosis: same     EBL: 5 ml IVF: 500 ml Drains: none Disposition: Extubated and Stable to PACU Complications: none   No foley catheter was placed.     Preoperative Note:    Risks of surgery discussed in clinic.   Operative Note:      The patient was then brought from the preoperative center with intravenous access established.  The patient underwent general anesthesia and endotracheal tube intubation, then was rotated on the [facility] table where all pressure points were appropriately padded.  The prior internal pulse generator site was identified.  The skin was then thoroughly cleansed.  Perioperative antibiotic prophylaxis was administered.  Sterile prep and drapes were then applied and a timeout was then observed.    The IPG incision site was opened and the prior IPG exposed.  It was removed and disconnected.  The leads were carefully freed and wiped clean.    The leads were then attached to the new IPG and impedances checked.  The leads were then tightened.  The IPG was then inserted into the pouch.   The wound was irrigated, then closed in layers with 0 and 2-0 vicryl.  The skin was approximated with monocryl. A sterile dressing was applied.    Patient was then rotated back to the preoperative bed awakened from anesthesia and taken to recovery all counts are correct in this case.   I performed the entire procedure with the assistance of Manning Charity PA as an Designer, television/film set.     Venetia Night MD

## 2020-12-08 NOTE — H&P (Signed)
I have reviewed and confirmed my history and physical from 11/25/2020 with no additions or changes. Plan for replacement of his internal pulse generator.  Risks and benefits reviewed.  Heart sounds normal no MRG. Chest Clear to Auscultation Bilaterally.

## 2020-12-08 NOTE — Transfer of Care (Signed)
Immediate Anesthesia Transfer of Care Note  Patient: Noah Vasquez  Procedure(s) Performed: REPLACEMENT OF INTERNAL PULSE GENERATOR  Patient Location: PACU  Anesthesia Type:General  Level of Consciousness: awake, drowsy and patient cooperative  Airway & Oxygen Therapy: Patient Spontanous Breathing and Patient connected to face mask oxygen  Post-op Assessment: Report given to RN and Post -op Vital signs reviewed and stable  Post vital signs: Reviewed and stable  Last Vitals:  Vitals Value Taken Time  BP 135/82 12/08/20 0831  Temp 36.2 C 12/08/20 0830  Pulse 93 12/08/20 0835  Resp 18 12/08/20 0835  SpO2 98 % 12/08/20 0835  Vitals shown include unvalidated device data.  Last Pain:  Vitals:   12/08/20 0830  TempSrc:   PainSc: 0-No pain      Patients Stated Pain Goal: 2 (12/08/20 3016)  Complications: No notable events documented.

## 2020-12-08 NOTE — Progress Notes (Signed)
PHARMACY -  BRIEF ANTIBIOTIC NOTE   Pharmacy has received consult(s) for Cefazolin and Vancomycin pre-op from an OR provider.  The patient's profile has been reviewed for ht/wt/allergies/indication/available labs.    One time order(s) placed for Cefazolin 2 gm and Vancomycin 1500 mg per pt wt: 109.2 kg  Further antibiotics/pharmacy consults should be ordered by admitting physician if indicated.                       Thank you, Otelia Sergeant, PharmD, [facility] 12/08/2020 6:22 AM

## 2020-12-08 NOTE — Anesthesia Procedure Notes (Signed)
Procedure Name: Intubation Date/Time: 12/08/2020 7:25 AM Performed by: Mohammed Kindle, CRNA Pre-anesthesia Checklist: Patient identified, Emergency Drugs available, Suction available and Patient being monitored Patient Re-evaluated:Patient Re-evaluated prior to induction Oxygen Delivery Method: Circle system utilized Preoxygenation: Pre-oxygenation with 100% oxygen Induction Type: IV induction Ventilation: Mask ventilation without difficulty Laryngoscope Size: McGraph and 3 Grade View: Grade I Tube type: Oral Tube size: 7.0 mm Number of attempts: 1 Airway Equipment and Method: Stylet and Oral airway Placement Confirmation: ETT inserted through vocal cords under direct vision, positive ETCO2, breath sounds checked- equal and bilateral and CO2 detector Secured at: 21 cm Tube secured with: Tape Dental Injury: Teeth and Oropharynx as per pre-operative assessment

## 2020-12-08 NOTE — Anesthesia Postprocedure Evaluation (Signed)
Anesthesia Post Note  Patient: Noah Vasquez  Procedure(s) Performed: REPLACEMENT OF INTERNAL PULSE GENERATOR  Patient location during evaluation: PACU Anesthesia Type: General Level of consciousness: awake and alert Pain management: pain level controlled Vital Signs Assessment: post-procedure vital signs reviewed and stable Respiratory status: spontaneous breathing, nonlabored ventilation, respiratory function stable and patient connected to nasal cannula oxygen Cardiovascular status: blood pressure returned to baseline and stable Postop Assessment: no apparent nausea or vomiting Anesthetic complications: no   No notable events documented.   Last Vitals:  Vitals:   12/08/20 1000 12/08/20 1019  BP: 117/79 113/79  Pulse: 80 84  Resp: 18 16  Temp: (!) 36.4 C 36.7 C  SpO2: 99% 99%    Last Pain:  Vitals:   12/08/20 1019  TempSrc: Temporal  PainSc:                  Cathleen Corti

## 2020-12-14 ENCOUNTER — Encounter: Payer: Self-pay | Admitting: Neurosurgery

## 2021-05-09 ENCOUNTER — Other Ambulatory Visit: Payer: Self-pay

## 2021-05-09 ENCOUNTER — Ambulatory Visit
Admission: RE | Admit: 2021-05-09 | Discharge: 2021-05-09 | Disposition: A | Discharge status: Home / Self Care | Payer: Medicaid Other | Source: Ambulatory Visit | Attending: Family Medicine | Admitting: Family Medicine

## 2021-05-09 VITALS — BP 130/89 | HR 96 | Temp 98.1°F | Resp 18

## 2021-05-09 DIAGNOSIS — J3089 Other allergic rhinitis: Secondary | ICD-10-CM

## 2021-05-09 DIAGNOSIS — J4541 Moderate persistent asthma with (acute) exacerbation: Secondary | ICD-10-CM

## 2021-05-09 MED ORDER — DEXAMETHASONE SODIUM PHOSPHATE 10 MG/ML IJ SOLN
10.0000 mg | Freq: Once | INTRAMUSCULAR | Status: AC
  Administered 2021-05-09: 10 mg via INTRAMUSCULAR

## 2021-05-09 MED ORDER — PREDNISONE 20 MG PO TABS: 40.0000 mg | ORAL_TABLET | Freq: Every day | ORAL | 0 refills | Status: DC

## 2021-05-09 MED ORDER — BUDESONIDE-FORMOTEROL FUMARATE 160-4.5 MCG/ACT IN AERO: 2.0000 | INHALATION_SPRAY | Freq: Two times a day (BID) | RESPIRATORY_TRACT | 0 refills | Status: AC

## 2021-05-09 NOTE — ED Provider Notes (Signed)
[facility]    CSN: [id number] Arrival date & time: 05/09/21  1740      History   Chief Complaint Chief Complaint  Patient presents with   Cough    Cough, wheezing and fever    HPI Noah Vasquez is a 41 y.o. male.   Presenting today with ongoing hacking productive cough, wheezing, chest tightness, shortness of breath, nasal congestion.  States his symptoms started about a week ago, had a televisit with provider who diagnosed him with bronchitis and gave him azithromycin and prednisone which she just finished with minimal benefit.  He also has an albuterol inhaler at home for his asthma which he states is also not helping much.  Denies fever, chills, body aches, chest pain, abdominal pain, nausea vomiting or diarrhea.  Declines COVID and flu testing.  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   Chronic pain syndrome 07/12/2019   Lumbar post-laminectomy syndrome 07/12/2019   Tobacco use 05/30/2019   Hyperlipidemia 09/04/2014   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   DEPRESSION 09/19/2007   CONSTIPATION 09/19/2007   SMOKER 09/06/2007   DISC DISEASE, LUMBOSACRAL SPINE 09/06/2007   INSOMNIA 09/06/2007   Past Surgical History:  Procedure Laterality Date   BACK SURGERY     CARPAL TUNNEL RELEASE Right 07/24/2019   Procedure: CARPAL TUNNEL RELEASE;  Surgeon: Vickki Hearing, MD;  Location: [facility];  Service: Orthopedics;  Laterality: Right;   CARPAL TUNNEL RELEASE Left 08/21/2019   Procedure: CARPAL TUNNEL RELEASE;  Surgeon: Vickki Hearing, MD;  Location: [facility];  Service: Orthopedics;  Laterality: Left;   HERNIA REPAIR     SACRAL NERVE STIMULATOR PLACEMENT Bilateral 2014   SPINAL CORD STIMULATOR BATTERY EXCHANGE N/A 12/08/2020   Procedure: REPLACEMENT OF INTERNAL PULSE GENERATOR;  Surgeon: Venetia Night, MD;  Location: [facility];  Service: Neurosurgery;  Laterality: N/A;   UMBILICAL HERNIA REPAIR N/A 05/26/2020   Procedure: HERNIA REPAIR UMBILICAL ADULT WITH MESH ;  Surgeon: Lucretia Roers, MD;  Location: [facility];  Service: General;  Laterality: N/A;     Home Medications    Prior to Admission medications   Medication Sig Start Date End Date Taking? Authorizing Provider  budesonide-formoterol (SYMBICORT) 160-4.5 MCG/ACT inhaler Inhale 2 puffs into the lungs 2 (two) times daily. 05/09/21  Yes Particia Nearing, PA-C  predniSONE (DELTASONE) 20 MG tablet Take 2 tablets (40 mg total) by mouth daily with breakfast. 05/09/21  Yes Particia Nearing, PA-C  cyclobenzaprine (FLEXERIL) 10 MG tablet Take 10 mg by mouth 3 (three) times daily as needed for muscle pain. 05/17/20   [provider]  docusate sodium (COLACE) 100 MG capsule Take 100 mg by mouth daily as needed for mild constipation.    [provider]  FLUoxetine (PROZAC) 20 MG capsule Take 60 mg by mouth daily.    [provider]  GRALISE 600 MG TABS Take 1,800 mg by mouth at bedtime. 04/22/19   [provider]  naproxen (NAPROSYN) 500 MG tablet Take 500 mg by mouth 2 (two) times daily with a meal.    [provider]  ondansetron (ZOFRAN) 4 MG tablet Take 1 tablet (4 mg total) by mouth every 8 (eight) hours as needed. Patient not taking: Reported on 11/29/2020 05/26/20 05/26/21  Lucretia RoersBridges, Lindsay C, MD  oxyCODONE-acetaminophen (PERCOCET) 10-325 MG tablet  Take 1 tablet by mouth every 6 (six) hours as needed for pain. 05/17/20   [provider]    Family History Family History  Problem Relation Age of Onset   Diabetes Mother    Heart disease Mother    High blood pressure Mother    Heart disease Father     Social History Social History   Tobacco Use   Smoking status: Every Day    Packs/day: 1.00    Years: 16.00    Pack years: 16.00    Types: Cigarettes   Smokeless tobacco: Never  Vaping Use   Vaping Use: Never used  Substance Use Topics   Alcohol use: No    Alcohol/week: 0.0 standard drinks   Drug use: No     Allergies   Wellbutrin [bupropion] and Bee venom   Review of Systems Review of Systems Per HPI  Physical Exam Triage Vital Signs ED Triage Vitals  Enc Vitals Group     BP 05/09/21 1809 130/89     Pulse Rate 05/09/21 1809 96     Resp 05/09/21 1809 18     Temp 05/09/21 1809 98.1 F (36.7 C)     Temp Source 05/09/21 1809 Oral     SpO2 05/09/21 1809 97 %     Weight --      Height --      Head Circumference --      Peak Flow --      Pain Score 05/09/21 1807 0     Pain Loc --      Pain Edu? --      Excl. in GC? --    No data found.  Updated Vital Signs BP 130/89 (BP Location: Right Arm)    Pulse 96    Temp 98.1 F (36.7 C) (Oral)    Resp 18    SpO2 97%   Visual Acuity Right Eye Distance:   Left Eye Distance:   Bilateral Distance:    Right Eye Near:   Left Eye Near:    Bilateral Near:     Physical Exam Vitals and nursing note reviewed.  Constitutional:      Appearance: He is well-developed.  HENT:     Head: Atraumatic.     Right Ear: External ear normal.     Left Ear: External ear normal.     Nose: Rhinorrhea present.     Mouth/Throat:     Pharynx: Posterior oropharyngeal erythema present. No oropharyngeal exudate.  Eyes:     Conjunctiva/sclera: Conjunctivae normal.     Pupils: Pupils are equal, round, and reactive to light.  Cardiovascular:     Rate and Rhythm: Normal rate  and regular rhythm.  Pulmonary:     Effort: Pulmonary effort is normal. No respiratory distress.     Breath sounds: Wheezing present. No rales.     Comments: Breathing comfortably on room air, speaking in full sentences.  Significant diffuse wheezes bilaterally Abdominal:     General: Bowel sounds are normal. There  is no distension.     Palpations: Abdomen is soft.     Tenderness: There is no abdominal tenderness. There is no guarding.  Musculoskeletal:        General: Normal range of motion.     Cervical back: Normal range of motion and neck supple.  Lymphadenopathy:     Cervical: No cervical adenopathy.  Skin:    General: Skin is warm and dry.  Neurological:     Mental Status: He is alert and oriented to person, place, and time.  Psychiatric:        Behavior: Behavior normal.   UC Treatments / Results  Labs (all labs ordered are listed, but only abnormal results are displayed) Labs Reviewed - No data to display  EKG  Radiology No results found.  Procedures Procedures (including critical care time)  Medications Ordered in UC Medications  dexamethasone (DECADRON) injection 10 mg (10 mg Intramuscular Given 05/09/21 1829)   Initial Impression / Assessment and Plan / UC Course  I have reviewed the triage vital signs and the nursing notes.  Pertinent labs & imaging results that were available during my care of the patient were reviewed by me and considered in my medical decision making (see chart for details).     Acute asthma exacerbation from likely viral upper respiratory infection.  Suspect also some underlying uncontrolled allergic rhinitis.  IM Decadron given in clinic, prednisone, Symbicort sent in addition to albuterol inhaler as needed.  Recommended starting good allergy regimen.  Declines viral testing today.  Final Clinical Impressions(s) / UC Diagnoses   Final diagnoses:  Moderate persistent asthma with acute exacerbation  Seasonal allergic rhinitis due to  other allergic trigger   Discharge Instructions   None    ED Prescriptions     Medication Sig Dispense Auth. Provider   predniSONE (DELTASONE) 20 MG tablet Take 2 tablets (40 mg total) by mouth daily with breakfast. 10 tablet Particia Nearing, PA-C   budesonide-formoterol [facility]) 160-4.5 MCG/ACT inhaler Inhale 2 puffs into the lungs 2 (two) times daily. 1 each Particia Nearing, PA-C      PDMP not reviewed this encounter.   Particia Nearing, [place] 05/09/21 1848

## 2021-05-09 NOTE — ED Triage Notes (Signed)
Patient states that last Thursday he did a televisit and they diagnosed him with bronchitis. They prescribed him Zpak and Prednisone but he is still coughing a lot.   Patient states that he has a lot of congestion   Patient states he finished the steroid today   Patient states that he does not want a Covid/Flu test   Denies Fever

## 2021-06-28 ENCOUNTER — Other Ambulatory Visit (HOSPITAL_COMMUNITY): Payer: Self-pay | Admitting: Neurology

## 2021-06-28 ENCOUNTER — Other Ambulatory Visit: Payer: Self-pay | Admitting: Neurology

## 2021-06-28 DIAGNOSIS — G4452 New daily persistent headache (NDPH): Secondary | ICD-10-CM

## 2021-06-29 IMAGING — CT CT ABD-PELV W/ CM
2 of 4 series · 17 of 46 positions shown, 19 images · IV contrast (Omnipaque or Isovue)
Comparison: October 23, 2015

CLINICAL DATA: Nonlocalized abdomen pain.

EXAM:
CT ABDOMEN AND PELVIS WITH CONTRAST
TECHNIQUE: Multidetector CT imaging of the abdomen and pelvis was performed
using the standard protocol following bolus administration of
intravenous contrast.
CONTRAST:  100mL OMNIPAQUE IOHEXOL 300 MG/ML  SOLN

[Series 2: axial st · axial · 0.87mm/px · z∈[-601,-101]mm · 14 of 110 slices shown, 16 images]
[im 5/110  soft-tissue]
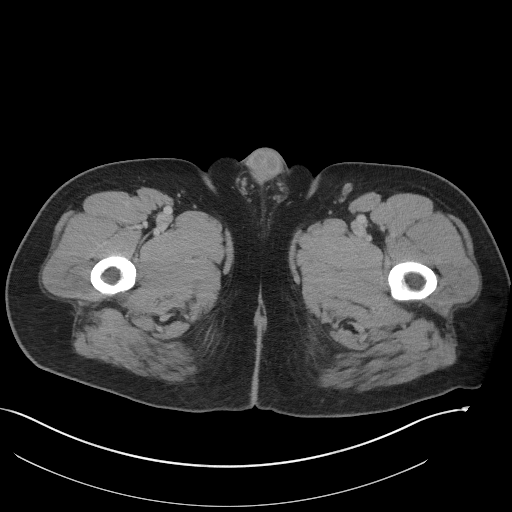
[im 5/110  bone]
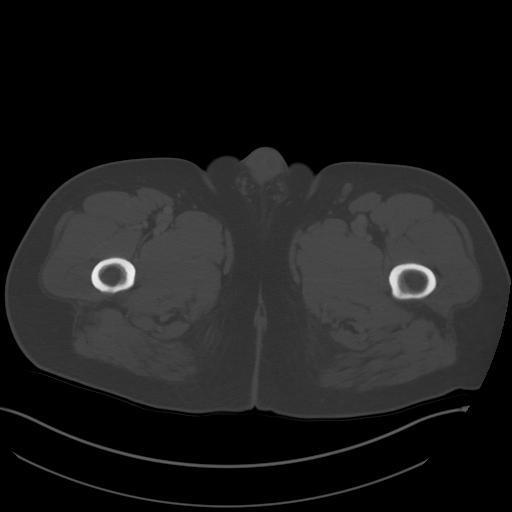
[im 15/110  soft-tissue]
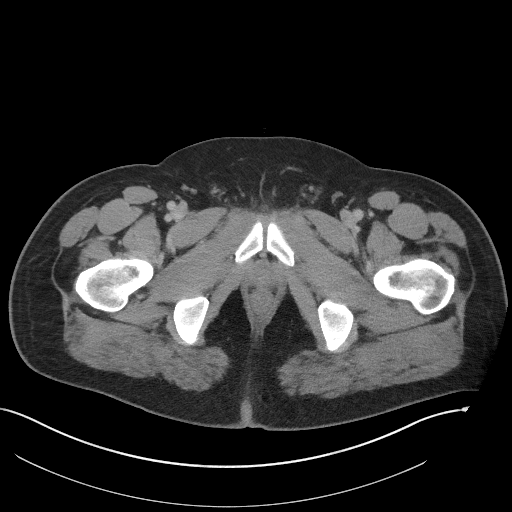
[im 19/110  soft-tissue]
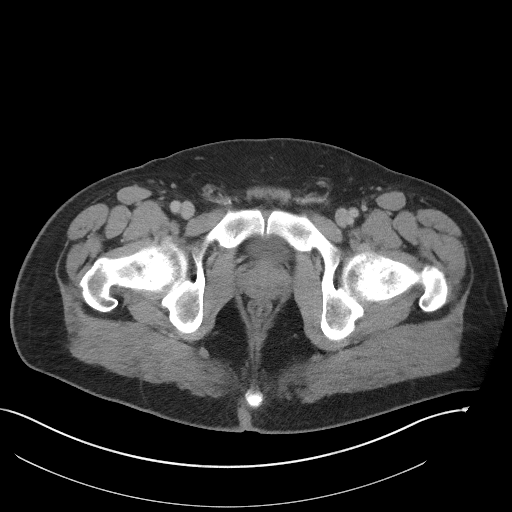
[im 29/110  soft-tissue]
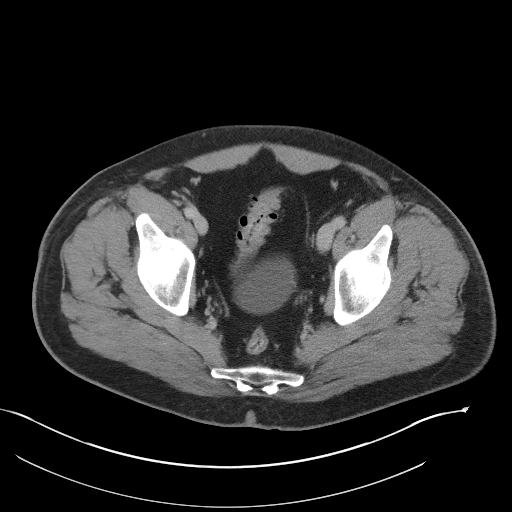
[im 38/110  soft-tissue]
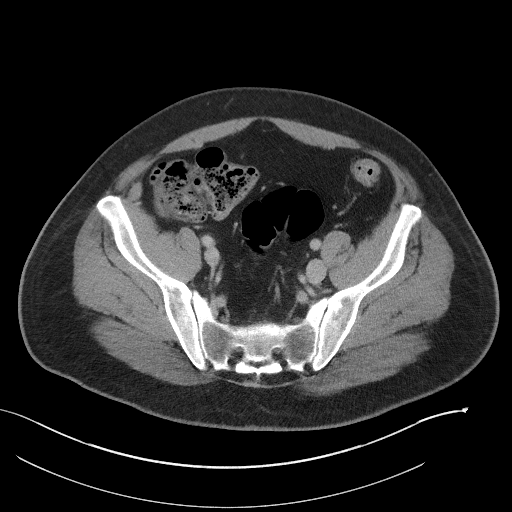
[im 43/110  soft-tissue]
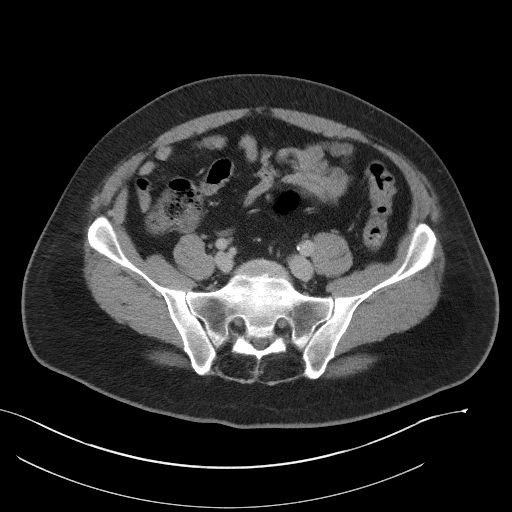
[im 53/110  soft-tissue]
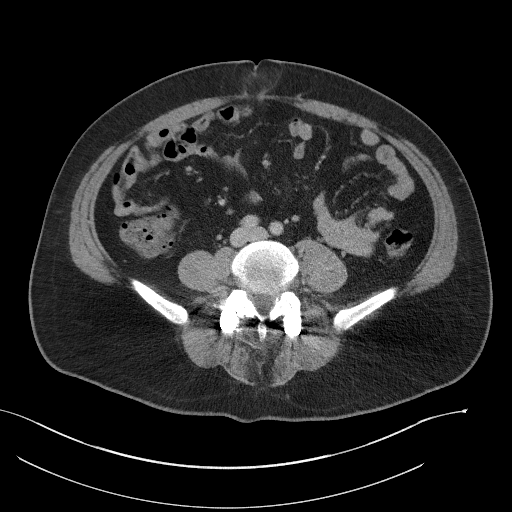
[im 57/110  soft-tissue]
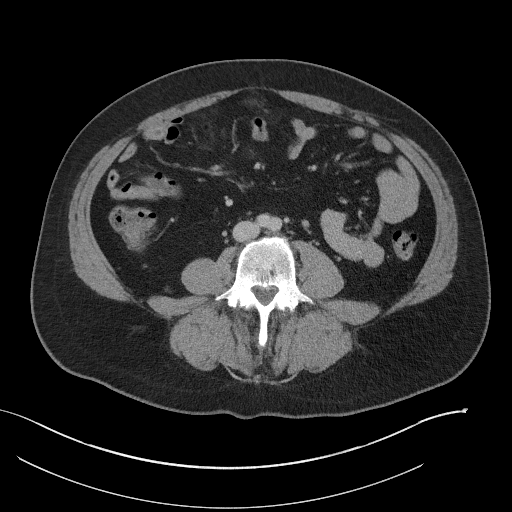
[im 67/110  soft-tissue]
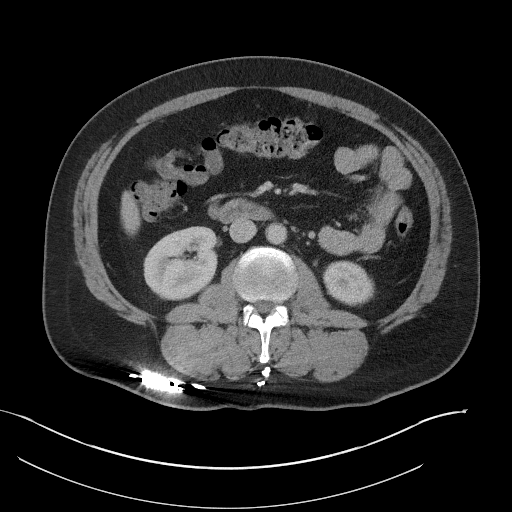
[im 67/110  bone]
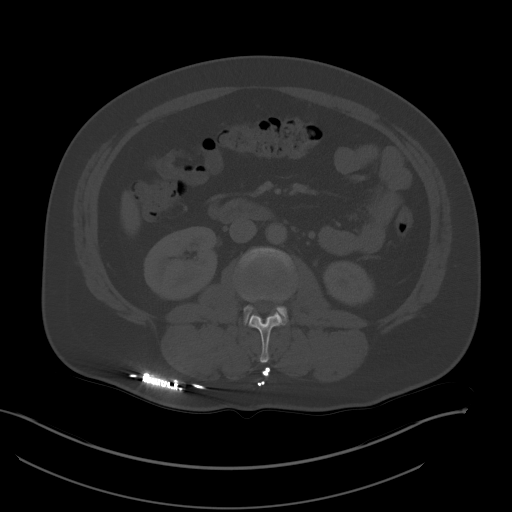
[im 72/110  soft-tissue]
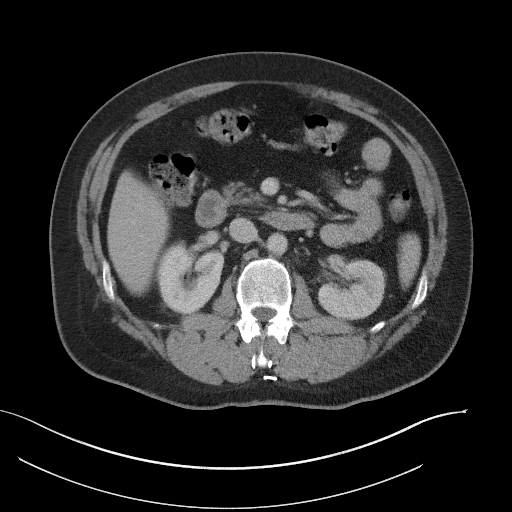
[im 81/110  soft-tissue]
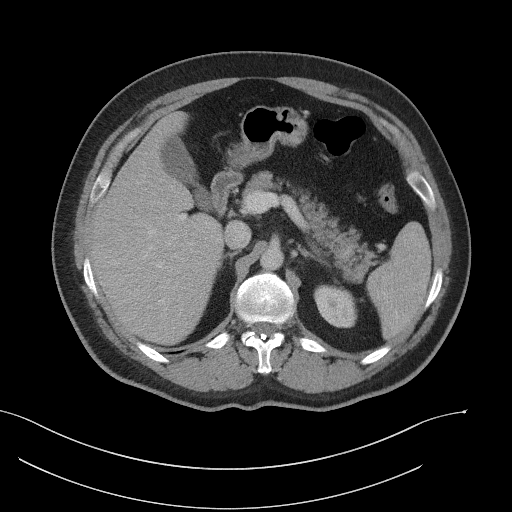
[im 91/110  soft-tissue]
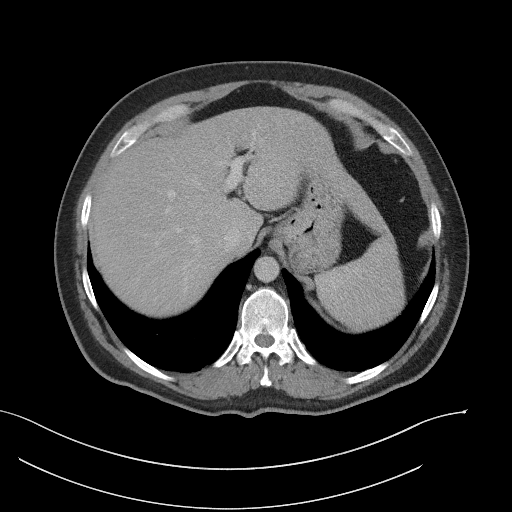
[im 95/110  soft-tissue]
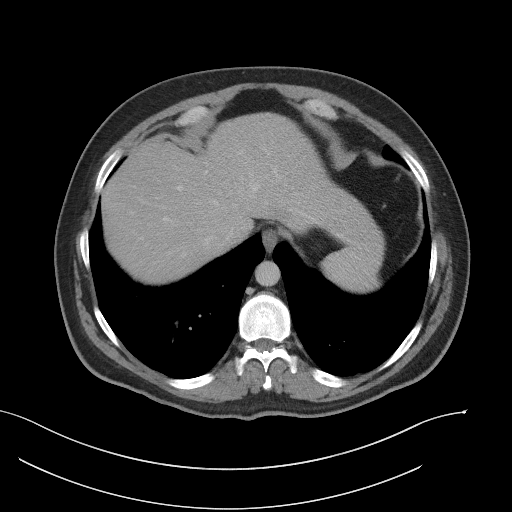
[im 105/110  soft-tissue]
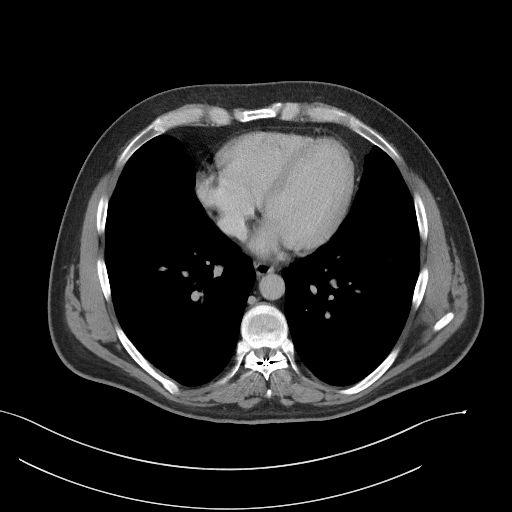

[Series 5: coronal st · coronal · 0.96mm/px · 3 of 124 slices shown]
[im 42/124  soft-tissue]
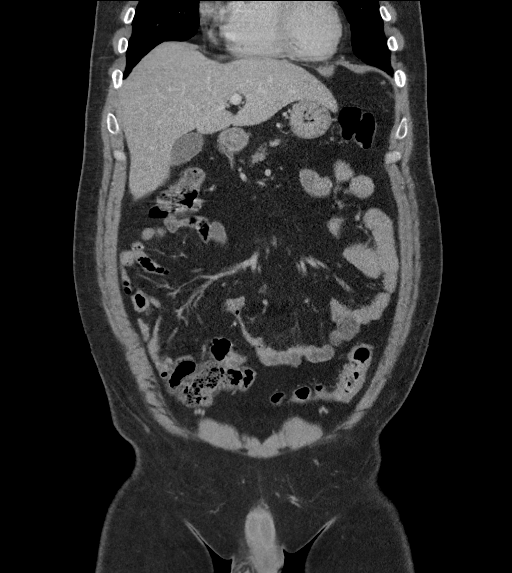
[im 55/124  soft-tissue]
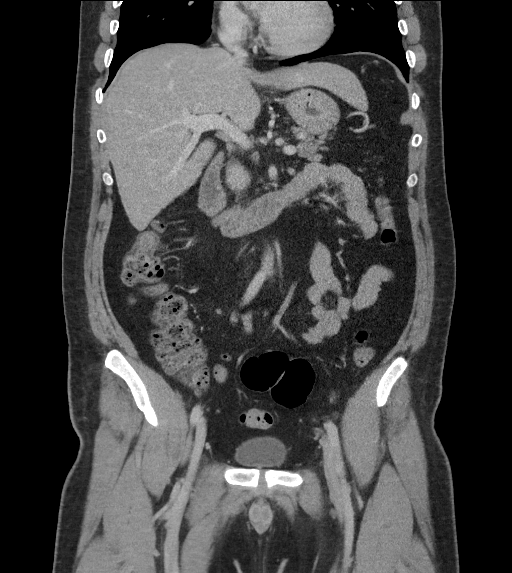
[im 69/124  soft-tissue]
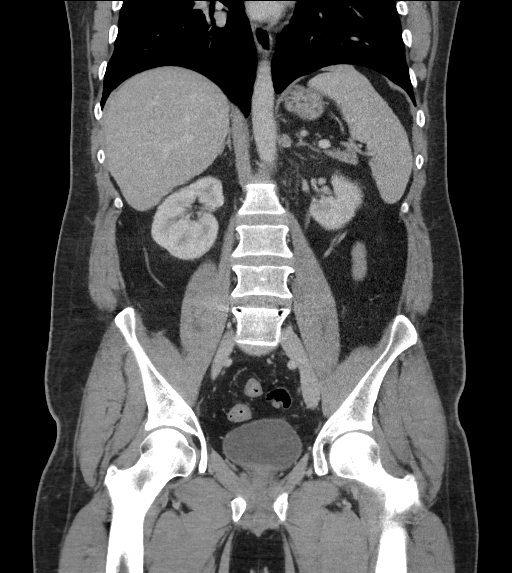

[17 of 46 positions shown; findings below may reference images not displayed]

FINDINGS: Lower chest: No acute abnormality.

Hepatobiliary: No focal liver abnormality is seen. No gallstones,
gallbladder wall thickening, or biliary dilatation.

Pancreas: Unremarkable. No pancreatic ductal dilatation or
surrounding inflammatory changes.

Spleen: Normal in size without focal abnormality.

Adrenals/Urinary Tract: Adrenal glands are unremarkable. Kidneys are
normal, without renal calculi, focal lesion, or hydronephrosis.
Bladder is unremarkable.

Stomach/Bowel: Stomach is within normal limits. Appendix is not seen
but no inflammation is noted around cecum. No evidence of bowel wall
thickening, distention, or inflammatory changes. There is
diverticulosis of colon without diverticulitis.

Vascular/Lymphatic: Aortic atherosclerosis. No enlarged abdominal or
pelvic lymph nodes.

Reproductive: Prostate is unremarkable.

Other: Umbilical herniation of mesenteric fat is noted.

Musculoskeletal: Patient status post prior posterior fusion L5-S1.
IMPRESSION: 1. No acute abnormality identified in the abdomen and pelvis.
2. Diverticulosis of colon without diverticulitis.
3. Aortic atherosclerosis.

Aortic Atherosclerosis ([id number]).

## 2021-07-13 ENCOUNTER — Ambulatory Visit (HOSPITAL_COMMUNITY): Payer: Medicaid Other

## 2021-07-28 ENCOUNTER — Ambulatory Visit (HOSPITAL_COMMUNITY)
Admission: RE | Admit: 2021-07-28 | Discharge: 2021-07-28 | Disposition: A | Discharge status: Home / Self Care | Payer: Medicaid Other | Source: Ambulatory Visit | Attending: Neurology | Admitting: Neurology

## 2021-07-28 DIAGNOSIS — G4452 New daily persistent headache (NDPH): Secondary | ICD-10-CM | POA: Insufficient documentation

## 2022-04-14 DIAGNOSIS — G894 Chronic pain syndrome: Secondary | ICD-10-CM | POA: Insufficient documentation

## 2022-04-14 DIAGNOSIS — Z79891 Long term (current) use of opiate analgesic: Secondary | ICD-10-CM | POA: Insufficient documentation

## 2022-06-29 ENCOUNTER — Encounter: Payer: Self-pay | Admitting: Radiology

## 2022-11-13 DIAGNOSIS — G8929 Other chronic pain: Secondary | ICD-10-CM | POA: Insufficient documentation

## 2022-12-02 ENCOUNTER — Ambulatory Visit
Admission: EM | Admit: 2022-12-02 | Discharge: 2022-12-02 | Disposition: A | Discharge status: Home / Self Care | Payer: Medicaid Other

## 2022-12-02 ENCOUNTER — Encounter: Payer: Self-pay | Admitting: Emergency Medicine

## 2022-12-02 ENCOUNTER — Ambulatory Visit (INDEPENDENT_AMBULATORY_CARE_PROVIDER_SITE_OTHER): Payer: Medicaid Other

## 2022-12-02 DIAGNOSIS — S67190A Crushing injury of right index finger, initial encounter: Secondary | ICD-10-CM | POA: Diagnosis not present

## 2022-12-02 DIAGNOSIS — S60021A Contusion of right index finger without damage to nail, initial encounter: Secondary | ICD-10-CM | POA: Diagnosis not present

## 2022-12-02 NOTE — Discharge Instructions (Addendum)
The x-ray is negative for fracture or dislocation.  Symptoms most likely have caused a contusion to the right index finger given the nature of the injury. Continue your current pain management regimen as needed. Continue applying ice to the right index finger.  Apply for 20 minutes, remove for 1 hour, repeat as much as possible to help decrease pain and swelling. Gentle exercises with the right index finger to decrease recovery time and improve joint mobility. As discussed, if symptoms or not improving over the next 2 to 3 weeks, or if they suddenly worsen, recommend following up with orthopedics.  Have given you information for [facility] and for [facility] [place]. Follow-up as needed.

## 2022-12-02 NOTE — ED Triage Notes (Signed)
Right Hand got caught between a machine and a door on Tuesday  States index finger hurts and pain gets worse.

## 2022-12-02 NOTE — ED Provider Notes (Signed)
[facility]    CSN: [id number] Arrival date & time: 12/02/22  4696      History   Chief Complaint No chief complaint on file.   HPI Noah Vasquez is a 42 y.o. male.   The history is provided by the patient.   The patient presents for complaints of an injury to the right index finger.  Patient states symptoms started over the past several days when he was attempting to move a piece of equipment.  States the joint of his index finger got caught between the door and the equipment.  Patient states since that time, he has noticed pain and swelling to the index finger.  He states that he has difficulty bending the finger because of the swelling.  He states when he does bend the finger, he also notices a pain that moves into the right hand.  Patient denies bruising or redness to the site.  Patient reports he applied ice last evening to the right index finger, but states that ice made it feel worse.  He has been taking his pain medication that he takes for his back for symptoms.  Patient is right-hand dominant. 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   Chronic pain syndrome 07/12/2019   Lumbar post-laminectomy syndrome 07/12/2019   Tobacco use 05/30/2019   Hyperlipidemia 09/04/2014   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   DEPRESSION 09/19/2007   CONSTIPATION 09/19/2007   SMOKER  09/06/2007   DISC DISEASE, LUMBOSACRAL SPINE 09/06/2007   INSOMNIA 09/06/2007    Past Surgical History:  Procedure Laterality Date   BACK SURGERY     CARPAL TUNNEL RELEASE Right 07/24/2019   Procedure: CARPAL TUNNEL RELEASE;  Surgeon: Vickki Hearing, MD;  Location: [facility];  Service: Orthopedics;  Laterality: Right;   CARPAL TUNNEL RELEASE Left 08/21/2019   Procedure: CARPAL TUNNEL RELEASE;  Surgeon: Vickki Hearing, MD;  Location: [facility];  Service: Orthopedics;  Laterality: Left;   HERNIA REPAIR     SACRAL NERVE STIMULATOR PLACEMENT Bilateral 2014   SPINAL CORD STIMULATOR BATTERY EXCHANGE N/A 12/08/2020   Procedure: REPLACEMENT OF INTERNAL PULSE GENERATOR;  Surgeon: Venetia Night, MD;  Location: [facility];  Service: Neurosurgery;  Laterality: N/A;   UMBILICAL HERNIA REPAIR N/A 05/26/2020   Procedure: HERNIA REPAIR UMBILICAL ADULT WITH MESH ;  Surgeon: Lucretia Roers, MD;  Location: [facility];  Service: General;  Laterality: N/A;       Home Medications    Prior to Admission medications   Medication Sig Start Date End Date Taking? Authorizing Provider  aspirin 81 MG chewable tablet Chew by mouth daily.   Yes [provider]  magnesium 30 MG tablet Take 30 mg by mouth once.   Yes [provider]  rosuvastatin (CRESTOR) 5 MG tablet Take 5 mg by mouth daily.   Yes [provider]  budesonide-formoterol (SYMBICORT) 160-4.5 MCG/ACT inhaler Inhale 2 puffs into the lungs 2 (two) times daily. 05/09/21   Particia Nearing, PA-C  cyclobenzaprine (FLEXERIL) 10 MG tablet Take 10 mg by mouth 3 (three) times daily as needed for muscle pain. 05/17/20   [provider]  docusate sodium (COLACE) 100 MG capsule Take 100 mg by mouth daily as needed for mild constipation.    [provider]  FLUoxetine (PROZAC) 20 MG capsule Take 60 mg by mouth daily.    [provider]  naproxen (NAPROSYN) 500 MG tablet Take 500 mg by mouth 2 (two) times  daily with a meal.    [provider]  oxyCODONE-acetaminophen (PERCOCET) 10-325 MG tablet Take 1 tablet by mouth every 6 (six) hours as needed for pain. 05/17/20   [provider]    Family History Family History  Problem Relation Age of Onset   Diabetes Mother    Heart disease Mother    High blood pressure Mother    Heart disease Father     Social History Social History   Tobacco Use   Smoking status: Every Day    Current packs/day: 1.00    Average packs/day: 1 pack/day for 16.0 years (16.0 ttl pk-yrs)    Types: Cigarettes   Smokeless tobacco: Never  Vaping Use   Vaping status: Never Used  Substance Use Topics   Alcohol use: No    Alcohol/week: 0.0 standard drinks of alcohol   Drug use: No     Allergies   Wellbutrin [bupropion] and Bee venom   Review of Systems Review of Systems Per HPI  Physical Exam Triage Vital Signs ED Triage Vitals  Encounter Vitals Group     BP 12/02/22 0959 134/89     Systolic BP Percentile --      Diastolic BP Percentile --      Pulse Rate 12/02/22 0959 75     Resp 12/02/22 0959 16     Temp 12/02/22 0959 97.8 F (36.6 C)     Temp Source 12/02/22 0959 Oral     SpO2 12/02/22 0959 94 %     Weight --      Height --      Head Circumference --      Peak Flow --      Pain Score 12/02/22 1000 8     Pain Loc --      Pain Education --      Exclude from Growth Chart --    No data found.  Updated Vital Signs BP 134/89 (BP Location: Right Arm)   Pulse 75   Temp 97.8 F (36.6 C) (Oral)   Resp 16   SpO2 94%   Visual Acuity Right Eye Distance:   Left Eye Distance:   Bilateral Distance:    Right Eye Near:   Left Eye Near:    Bilateral Near:     Physical Exam Vitals and nursing note reviewed.  Constitutional:      General: He is not in acute distress.    Appearance: Normal appearance.  HENT:     Head: Normocephalic.  Eyes:     Pupils: Pupils are equal, round, and reactive to light.  Pulmonary:      Effort: Pulmonary effort is normal.  Musculoskeletal:     Right hand: Swelling and tenderness present. Decreased range of motion. Decreased strength. Normal capillary refill. Normal pulse.     Cervical back: Normal range of motion.     Comments:  Swelling noted to the [facility] joint of the right index finger.  There is no obvious bruising, deformity, or erythema present.  Cap refill > 2 seconds, +2 radial pulse.  Skin:    General: Skin is warm and dry.  Neurological:     General: No focal deficit present.     Mental Status: He is alert and oriented to person, place, and time.  Psychiatric:        Mood and Affect: Mood normal.        Behavior: Behavior normal.      UC Treatments / Results  Labs (all labs ordered are listed, but only abnormal results are displayed) Labs Reviewed - No data to display  EKG   Radiology DG Hand Complete Right  Result Date: 12/02/2022 CLINICAL DATA:  Crush injury.  Pain around the second MCP joint. EXAM: RIGHT HAND - COMPLETE 3+ VIEW COMPARISON:  None Available. FINDINGS: There is no evidence of fracture or dislocation. There is no evidence of arthropathy or other focal bone abnormality. Soft tissues are unremarkable. IMPRESSION: Negative. Electronically Signed   By: Kennith Center M.D.   On: 12/02/2022 10:14    Procedures Procedures (including critical care time)  Medications Ordered in UC Medications - No data to display  Initial Impression / Assessment and Plan / UC Course  I have reviewed the triage vital signs and the nursing notes.  Pertinent labs & imaging results that were available during my care of the patient were reviewed by me and considered in my medical decision making (see chart for details).  The patient is well-appearing, he is in no acute distress, vital signs are stable.  X-ray of the right hand is negative for fracture or dislocation.  Patient continues to experience swelling to the [facility] joint of the right index finger.  Symptoms most  likely caused by a contusion based on the mechanism of injury, a crush injury.  Patient advised to continue applying ice to the right index finger along with gentle range of motion exercises.  Patient was advised that if symptoms or not improving over the next 2 to 3 weeks, it is recommended that he follow-up with orthopedics for further evaluation.  Patient was given information for [facility] [place] and for [facility].  Patient is in agreement with this plan of care and verbalizes understanding.  All questions were answered.  Patient is stable for discharge.   Final Clinical Impressions(s) / UC Diagnoses   Final diagnoses:  Contusion of right index finger without damage to nail, initial encounter  Crushing injury of right index finger, initial encounter     Discharge Instructions      The x-ray is negative for fracture or dislocation.  Symptoms most likely have caused a contusion to the right index finger given the nature of the injury. Continue your current pain management regimen as needed. Continue applying ice to the right index finger.  Apply for 20 minutes, remove for 1 hour, repeat as much as possible to help decrease pain and swelling. Gentle exercises with the right index finger to decrease recovery time and improve joint mobility. As discussed, if symptoms or not improving over the next 2 to 3 weeks, or if they suddenly worsen, recommend following up with orthopedics.  Have given you information for [facility] and for [facility] [place]. Follow-up as needed.     ED Prescriptions   None    PDMP not reviewed this encounter.   Abran Cantor, NP 12/02/22 1039

## 2023-05-17 ENCOUNTER — Other Ambulatory Visit: Payer: Self-pay

## 2023-05-17 ENCOUNTER — Ambulatory Visit
Admission: RE | Admit: 2023-05-17 | Discharge: 2023-05-17 | Disposition: A | Discharge status: Home / Self Care | Payer: Medicaid Other | Source: Ambulatory Visit | Attending: Family Medicine | Admitting: Family Medicine

## 2023-05-17 VITALS — BP 131/90 | HR 89 | Temp 98.0°F | Resp 20

## 2023-05-17 DIAGNOSIS — M778 Other enthesopathies, not elsewhere classified: Secondary | ICD-10-CM | POA: Diagnosis not present

## 2023-05-17 MED ORDER — DEXAMETHASONE SODIUM PHOSPHATE 10 MG/ML IJ SOLN
10.0000 mg | Freq: Once | INTRAMUSCULAR | Status: AC
  Administered 2023-05-17: 10 mg via INTRAMUSCULAR

## 2023-05-17 NOTE — ED Provider Notes (Signed)
[facility]    CSN: [id number] Arrival date & time: 05/17/23  1642      History   Chief Complaint Chief Complaint  Patient presents with   Arm Injury    Elbow pain for about a month not getting better.I haven't injured it or anything. - Entered by patient    HPI Noah Vasquez is a 43 y.o. male.   Presenting today with 1 month history of lateral right elbow pain.  Denies known injury prior to onset of symptoms and has not noticed any swelling, bruising, redness, numbness, tingling, loss of range of motion.  States the pain mainly occurs when he is moving the arm or trying to grip something.  Has tried a course of prednisone, tennis elbow braces and takes a muscle relaxer daily for chronic back pain all with no relief.    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   Chronic pain syndrome 07/12/2019   Lumbar post-laminectomy syndrome 07/12/2019   Tobacco use 05/30/2019   Hyperlipidemia 09/04/2014   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   DEPRESSION 09/19/2007   Constipation 09/19/2007   SMOKER 09/06/2007   DISC DISEASE, LUMBOSACRAL SPINE 09/06/2007   INSOMNIA 09/06/2007    Past Surgical History:  Procedure Laterality Date   BACK SURGERY     CARPAL TUNNEL RELEASE Right 07/24/2019   Procedure: CARPAL TUNNEL RELEASE;  Surgeon: Vickki Hearing, MD;  Location: [facility];  Service: Orthopedics;  Laterality: Right;   CARPAL TUNNEL RELEASE Left 08/21/2019   Procedure: CARPAL TUNNEL RELEASE;  Surgeon: Vickki Hearing, MD;  Location: [facility];  Service: Orthopedics;  Laterality: Left;   HERNIA REPAIR     SACRAL NERVE STIMULATOR PLACEMENT Bilateral 2014   SPINAL CORD STIMULATOR BATTERY EXCHANGE N/A 12/08/2020   Procedure: REPLACEMENT OF INTERNAL PULSE GENERATOR;  Surgeon: Venetia Night, MD;  Location: [facility];  Service: Neurosurgery;  Laterality: N/A;   UMBILICAL HERNIA REPAIR N/A 05/26/2020   Procedure: HERNIA REPAIR UMBILICAL ADULT WITH MESH ;  Surgeon: Lucretia Roers, MD;  Location: [facility];  Service: General;  Laterality: N/A;       Home Medications    Prior to Admission medications   Medication Sig Start Date End Date Taking? Authorizing Provider  aspirin 81 MG chewable tablet Chew by mouth daily.    [provider]  budesonide-formoterol (SYMBICORT) 160-4.5 MCG/ACT inhaler Inhale 2 puffs into the lungs 2 (two) times daily. 05/09/21   Particia Nearing, PA-C  cyclobenzaprine (FLEXERIL) 10 MG tablet Take 10 mg by mouth 3 (three) times daily as needed for muscle pain. 05/17/20   [provider]  docusate sodium (COLACE) 100 MG capsule Take 100 mg by mouth daily as needed for mild constipation.    [provider]  FLUoxetine (PROZAC) 20 MG capsule Take 60 mg by mouth daily.  [provider]  magnesium 30 MG tablet Take 30 mg by mouth once.    [provider]  naproxen (NAPROSYN) 500 MG tablet Take 500 mg by mouth 2 (two) times daily with a meal.    [provider]  oxyCODONE-acetaminophen (PERCOCET) 10-325 MG tablet Take 1 tablet by mouth every 6 (six) hours as needed for pain. 05/17/20   [provider]  rosuvastatin (CRESTOR) 5 MG tablet Take 5 mg by mouth daily.    [provider]    Family History Family History  Problem Relation Age of Onset   Diabetes Mother     Heart disease Mother    High blood pressure Mother    Heart disease Father     Social History Social History   Tobacco Use   Smoking status: Every Day    Current packs/day: 1.00    Average packs/day: 1 pack/day for 16.0 years (16.0 ttl pk-yrs)    Types: Cigarettes   Smokeless tobacco: Never  Vaping Use   Vaping status: Never Used  Substance Use Topics   Alcohol use: No    Alcohol/week: 0.0 standard drinks of alcohol   Drug use: No     Allergies   Wellbutrin [bupropion] and Bee venom   Review of Systems Review of Systems Per HPI  Physical Exam Triage Vital Signs ED Triage Vitals  Encounter Vitals Group     BP 05/17/23 1731 (!) 131/90     Systolic BP Percentile --      Diastolic BP Percentile --      Pulse Rate 05/17/23 1731 89     Resp 05/17/23 1731 20     Temp 05/17/23 1731 98 F (36.7 C)     Temp Source 05/17/23 1731 Oral     SpO2 05/17/23 1731 95 %     Weight --      Height --      Head Circumference --      Peak Flow --      Pain Score 05/17/23 1730 6     Pain Loc --      Pain Education --      Exclude from Growth Chart --    No data found.  Updated Vital Signs BP (!) 131/90 (BP Location: Left Arm)   Pulse 89   Temp 98 F (36.7 C) (Oral)   Resp 20   SpO2 95%   Visual Acuity Right Eye Distance:   Left Eye Distance:   Bilateral Distance:    Right Eye Near:   Left Eye Near:    Bilateral Near:     Physical Exam Vitals and nursing note reviewed.  Constitutional:      Appearance: Normal appearance.  HENT:     Head: Atraumatic.  Eyes:     Extraocular Movements: Extraocular movements intact.     Conjunctiva/sclera: Conjunctivae normal.  Cardiovascular:     Rate and Rhythm: Normal rate and regular rhythm.  Pulmonary:     Effort: Pulmonary effort is normal.     Breath sounds: Normal breath sounds.  Musculoskeletal:        General: Tenderness present. No swelling, deformity or signs of injury. Normal range of motion.     Cervical back:  Normal range of motion and neck supple.     Comments: Tenderness to palpation to the right lateral elbow  Skin:    General: Skin is warm and dry.     Findings: No bruising or erythema.  Neurological:  General: No focal deficit present.     Mental Status: He is oriented to person, place, and time.     Motor: No weakness.     Gait: Gait normal.     Comments: Right upper extremity neurovascularly intact  Psychiatric:        Mood and Affect: Mood normal.        Thought Content: Thought content normal.        Judgment: Judgment normal.      UC Treatments / Results  Labs (all labs ordered are listed, but only abnormal results are displayed) Labs Reviewed - No data to display  EKG   Radiology No results found.  Procedures Procedures (including critical care time)  Medications Ordered in UC Medications  dexamethasone (DECADRON) injection 10 mg (10 mg Intramuscular Given 05/17/23 1751)    Initial Impression / Assessment and Plan / UC Course  I have reviewed the triage vital signs and the nursing notes.  Pertinent labs & imaging results that were available during my care of the patient were reviewed by me and considered in my medical decision making (see chart for details).     Trial IM Decadron in addition to the muscle relaxer, Kinesiotape, massage, Epsom salt soaks.  Ortho follow-up if not resolving Final Clinical Impressions(s) / UC Diagnoses   Final diagnoses:  Elbow tendonitis     Discharge Instructions      We have given you a steroid shot today, and continue the muscle relaxer that you are already taking.  You may also use Kinesiotape, warm Epsom salt soaks, massage, stretches and avoid any exertional repetitive motions with this joint.  Follow-up with orthopedics if not resolving    ED Prescriptions   None    PDMP not reviewed this encounter.   Particia Nearing, [place] 05/17/23 1752

## 2023-05-17 NOTE — Discharge Instructions (Signed)
We have given you a steroid shot today, and continue the muscle relaxer that you are already taking.  You may also use Kinesiotape, warm Epsom salt soaks, massage, stretches and avoid any exertional repetitive motions with this joint.  Follow-up with orthopedics if not resolving

## 2023-05-17 NOTE — ED Triage Notes (Signed)
Pt reports right elbow pain x1 month. Denies any known injury.

## 2023-05-18 DIAGNOSIS — F1120 Opioid dependence, uncomplicated: Secondary | ICD-10-CM | POA: Insufficient documentation

## 2023-06-01 NOTE — Progress Notes (Unsigned)
  Intake history:  There were no vitals taken for this visit. There is no height or weight on file to calculate BMI.    WHAT ARE WE SEEING YOU FOR TODAY?   {Location ext BMWU:13244}  How long has this bothered you? (DOI?DOS?WS?)  {TIME; AGO:17960}  Anticoag.  {yes/no:20286}  Diabetes {yes/no:20286}  Heart disease {yes/no:20286}  Hypertension {yes/no:20286}  SMOKING HX {yes/no:20286}  Kidney disease {yes/no:20286}  Any ALLERGIES ______________________________________________   Treatment:  Have you taken:  Tylenol {yes/no:20286}  Advil {yes/no:20286}  Had PT {yes/no:20286}  Had injection {yes/no:20286}  Other  _________________________

## 2023-06-04 ENCOUNTER — Ambulatory Visit: Payer: Medicaid Other | Admitting: Orthopedic Surgery

## 2023-06-04 ENCOUNTER — Encounter: Payer: Self-pay | Admitting: Orthopedic Surgery

## 2023-06-04 VITALS — BP 180/110 | HR 88 | Ht 69.0 in | Wt 248.0 lb

## 2023-06-04 DIAGNOSIS — M7711 Lateral epicondylitis, right elbow: Secondary | ICD-10-CM

## 2023-06-04 MED ORDER — MELOXICAM 7.5 MG PO TABS: 7.5000 mg | ORAL_TABLET | Freq: Every day | ORAL | 5 refills | Status: DC

## 2023-06-04 MED ORDER — METHYLPREDNISOLONE ACETATE 40 MG/ML IJ SUSP
40.0000 mg | Freq: Once | INTRAMUSCULAR | Status: AC
  Administered 2023-06-04: 40 mg via INTRA_ARTICULAR

## 2023-06-04 NOTE — Addendum Note (Signed)
Addended by: Caffie Damme on: 06/04/2023 09:48 AM   Modules accepted: Orders

## 2023-06-04 NOTE — Patient Instructions (Signed)
 Physical therapy has been ordered for you at [facility]. They should call you to schedule, [phone] is the phone number to call, if you want to call to schedule.

## 2023-06-04 NOTE — Progress Notes (Signed)
  Intake history:  BP (!) 180/110 Comment: advised patient to monitor he states high due to pain  Pulse 88   Ht 5\' 9"  (1.753 m)   Wt 248 lb (112.5 kg)   BMI 36.62 kg/m  Body mass index is 36.62 kg/m.    WHAT ARE WE SEEING YOU FOR TODAY?   Right elbow pain   How long has this bothered you? (DOI?DOS?WS?)  Since christmas getting worse  Anticoag.  No  Diabetes No  Heart disease No  Hypertension No  SMOKING HX Yes  Kidney disease No  Any ALLERGIES ________________bee stings and Wellbutrin ______________________________   Treatment:  Have you taken:  Tylenol No  Advil No  Had PT No  Had injection Yes IM injection not in elbow   Other  _pain meds and muscle relaxer not helping has also taken a steroid taper with no relief _____getting worse __________________   Chief Complaint  Patient presents with   Elbow Pain    Righ t    History of present illness 43 year old male previously seen by urgent care with right elbow pain for 6 months he tried some medication bracing which was actually a sleeve still has lateral pain and is lost motion  Pain increases with trying to lift things  He is a Runner, broadcasting/film/video  Exam shows lateral tenderness over the elbow decreased flexion and extension pain with resisted extension pain with resisted extension of the long finger  No neurovascular deficits  Encounter Diagnosis  Name Primary?   Lateral epicondylitis, right elbow Yes   Recommend wrist splint as he has too much pain at the elbow to apply the tennis elbow brace Anti-inflammatories Physical therapy Injection   Procedure note injection for right tennis elbow   Diagnosis right tennis elbow  Anesthesia ethyl chloride was used Alcohol use is clean the skin  After we obtained verbal consent and timeout a 25-gauge needle was used to inject 40 mg of Depo-Medrol and 3 cc of 1% lidocaine just distal to the insertion of the ECRB  There were no complications and a sterile  bandage was applied.    Meds ordered this encounter  Medications   meloxicam (MOBIC) 7.5 MG tablet    Sig: Take 1 tablet (7.5 mg total) by mouth daily.    Dispense:  30 tablet    Refill:  5    Follow-up in 6 weeks

## 2023-07-16 ENCOUNTER — Ambulatory Visit: Payer: Medicaid Other | Admitting: Orthopedic Surgery

## 2023-07-27 ENCOUNTER — Ambulatory Visit: Admitting: Orthopedic Surgery

## 2023-08-09 ENCOUNTER — Ambulatory Visit (HOSPITAL_COMMUNITY): Payer: Medicaid Other | Admitting: Occupational Therapy

## 2023-08-09 ENCOUNTER — Encounter (HOSPITAL_COMMUNITY): Admitting: Occupational Therapy

## 2023-08-10 ENCOUNTER — Telehealth (HOSPITAL_COMMUNITY): Payer: Self-pay | Admitting: Occupational Therapy

## 2023-08-10 NOTE — Telephone Encounter (Signed)
 Called pt regarding no-show on 08/09/23. No answer, left voicemail requesting a call back to reschedule. Front office alerted to place back on workque.   Ezra Sites, OTR/L  [phone] 08/09/23

## 2023-12-04 ENCOUNTER — Other Ambulatory Visit: Payer: Self-pay | Admitting: Orthopedic Surgery

## 2023-12-04 DIAGNOSIS — M7711 Lateral epicondylitis, right elbow: Secondary | ICD-10-CM

## 2023-12-08 ENCOUNTER — Ambulatory Visit
Admission: RE | Admit: 2023-12-08 | Discharge: 2023-12-08 | Disposition: A | Discharge status: Home / Self Care | Payer: Self-pay | Attending: Family Medicine | Admitting: Family Medicine

## 2023-12-08 VITALS — BP 140/91 | HR 106 | Temp 99.0°F | Resp 18

## 2023-12-08 DIAGNOSIS — J069 Acute upper respiratory infection, unspecified: Secondary | ICD-10-CM

## 2023-12-08 MED ORDER — AZELASTINE HCL 0.1 % NA SOLN: 1.0000 | Freq: Two times a day (BID) | NASAL | 0 refills | Status: AC

## 2023-12-08 MED ORDER — PROMETHAZINE-DM 6.25-15 MG/5ML PO SYRP: 5.0000 mL | ORAL_SOLUTION | Freq: Four times a day (QID) | ORAL | 0 refills | Status: AC | PRN

## 2023-12-08 NOTE — ED Provider Notes (Signed)
 [facility]    CSN: [id number] Arrival date & time: 12/08/23  1303      History   Chief Complaint Chief Complaint  Patient presents with   Generalized Body Aches    Nasal congestion,headache,ear pain,body aches,sore throat - Entered by patient    HPI Noah Vasquez is a 43 y.o. male.   Patient presenting today with 2-day history of nasal congestion, productive cough, sinus drainage, headache, ear pain.  Denies fever, chills, chest pain, shortness of breath, abdominal pain, vomiting, diarrhea.  Took a home COVID test that was negative.  So far not trying anything over-the-counter for symptoms.  No known history of asthma.    Past Medical History:  Diagnosis Date   Anxiety    Bronchitis    Chronic back pain    Depression    Headache(784.0)    History of 2019 novel coronavirus disease (COVID-19) 06/09/2020   Hypertension     Patient Active Problem List   Diagnosis Date Noted   Opioid type dependence, continuous (HCC) 05/18/2023   Chronic midline low back pain without sciatica 11/13/2022   Encounter for long-term use of opiate analgesic 04/14/2022   Pain syndrome, chronic 04/14/2022   Umbilical hernia without obstruction and without gangrene 05/13/2020   S/P carpal tunnel release left 08/21/19 09/05/2019   Carpal tunnel syndrome, left upper limb    Carpal tunnel syndrome of right wrist    Gastroesophageal reflux disease 07/15/2019   Low back pain 07/15/2019   Lumbar spondylosis 07/15/2019   Moderate recurrent major depression (HCC) 07/15/2019   Chronic pain syndrome 07/12/2019   Lumbar post-laminectomy syndrome 07/12/2019   Tobacco use 05/30/2019   Hyperlipidemia 09/04/2014   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   DEPRESSION 09/19/2007   Constipation 09/19/2007   SMOKER 09/06/2007   DISC DISEASE, LUMBOSACRAL SPINE 09/06/2007   INSOMNIA 09/06/2007    Past Surgical History:  Procedure Laterality Date   BACK SURGERY     CARPAL  TUNNEL RELEASE Right 07/24/2019   Procedure: CARPAL TUNNEL RELEASE;  Surgeon: Margrette Taft BRAVO, MD;  Location: [facility];  Service: Orthopedics;  Laterality: Right;   CARPAL TUNNEL RELEASE Left 08/21/2019   Procedure: CARPAL TUNNEL RELEASE;  Surgeon: Margrette Taft BRAVO, MD;  Location: [facility];  Service: Orthopedics;  Laterality: Left;   HERNIA REPAIR     SACRAL NERVE STIMULATOR PLACEMENT Bilateral 2014   SPINAL CORD STIMULATOR BATTERY EXCHANGE N/A 12/08/2020   Procedure: REPLACEMENT OF INTERNAL PULSE GENERATOR;  Surgeon: Clois Fret, MD;  Location: [facility];  Service: Neurosurgery;  Laterality: N/A;   UMBILICAL HERNIA REPAIR N/A 05/26/2020   Procedure: HERNIA REPAIR UMBILICAL ADULT WITH MESH ;  Surgeon: Kallie Manuelita BROCKS, MD;  Location: [facility];  Service: General;  Laterality: N/A;       Home Medications    Prior to Admission medications   Medication Sig Start Date End Date Taking? Authorizing Provider  azelastine  (ASTELIN ) 0.1 % nasal spray Place 1 spray into both nostrils 2 (two) times daily. Use in each nostril as directed 12/08/23  Yes Stuart Vernell Norris, PA-C  promethazine -dextromethorphan (PROMETHAZINE -DM) 6.25-15 MG/5ML syrup Take 5 mLs by mouth 4 (four) times daily as needed. 12/08/23  Yes Stuart Vernell Norris, PA-C  aspirin 81 MG chewable tablet Chew by mouth daily.    [provider]  budesonide -formoterol  (SYMBICORT ) 160-4.5 MCG/ACT inhaler Inhale 2 puffs into the lungs 2 (two) times daily. 05/09/21   Stuart Vernell Norris, PA-C  cyclobenzaprine (FLEXERIL) 10 MG tablet Take  10 mg by mouth 3 (three) times daily as needed for muscle pain. 05/17/20   [provider]  docusate sodium (COLACE) 100 MG capsule Take 100 mg by mouth daily as needed for mild constipation.    [provider]  FLUoxetine (PROZAC) 20 MG capsule Take 60 mg by mouth daily.    [provider]  magnesium 30 MG tablet Take 30 mg by mouth once.    [provider]   meloxicam  (MOBIC ) 7.5 MG tablet Take 1 tablet (7.5 mg total) by mouth daily. 12/04/23   Margrette Taft BRAVO, MD  oxyCODONE -acetaminophen  (PERCOCET) 10-325 MG tablet Take 1 tablet by mouth every 6 (six) hours as needed for pain. 05/17/20   [provider]  rosuvastatin (CRESTOR) 5 MG tablet Take 5 mg by mouth daily.    [provider]    Family History Family History  Problem Relation Age of Onset   Diabetes Mother    Heart disease Mother    High blood pressure Mother    Heart disease Father     Social History Social History   Tobacco Use   Smoking status: Every Day    Current packs/day: 1.00    Average packs/day: 1 pack/day for 16.0 years (16.0 ttl pk-yrs)    Types: Cigarettes   Smokeless tobacco: Never  Vaping Use   Vaping status: Never Used  Substance Use Topics   Alcohol use: No    Alcohol/week: 0.0 standard drinks of alcohol   Drug use: No     Allergies   Wellbutrin [bupropion] and Bee venom   Review of Systems Review of Systems PER HPI  Physical Exam Triage Vital Signs ED Triage Vitals  Encounter Vitals Group     BP 12/08/23 1306 (!) 140/91     Girls Systolic BP Percentile --      Girls Diastolic BP Percentile --      Boys Systolic BP Percentile --      Boys Diastolic BP Percentile --      Pulse Rate 12/08/23 1306 (!) 106     Resp 12/08/23 1306 18     Temp 12/08/23 1306 99 F (37.2 C)     Temp Source 12/08/23 1306 Oral     SpO2 12/08/23 1306 97 %     Weight --      Height --      Head Circumference --      Peak Flow --      Pain Score 12/08/23 1307 8     Pain Loc --      Pain Education --      Exclude from Growth Chart --    No data found.  Updated Vital Signs BP (!) 140/91 (BP Location: Right Arm)   Pulse (!) 106   Temp 99 F (37.2 C) (Oral)   Resp 18   SpO2 97%   Visual Acuity Right Eye Distance:   Left Eye Distance:   Bilateral Distance:    Right Eye Near:   Left Eye Near:    Bilateral Near:     Physical  Exam Vitals and nursing note reviewed.  Constitutional:      Appearance: He is well-developed.  HENT:     Head: Atraumatic.     Right Ear: External ear normal.     Left Ear: External ear normal.     Mouth/Throat:     Pharynx: No oropharyngeal exudate.  Eyes:     Conjunctiva/sclera: Conjunctivae normal.     Pupils:  Pupils are equal, round, and reactive to light.  Cardiovascular:     Rate and Rhythm: Normal rate and regular rhythm.  Pulmonary:     Effort: Pulmonary effort is normal. No respiratory distress.     Breath sounds: No wheezing or rales.  Musculoskeletal:        General: Normal range of motion.     Cervical back: Normal range of motion and neck supple.  Lymphadenopathy:     Cervical: No cervical adenopathy.  Skin:    General: Skin is warm and dry.  Neurological:     Mental Status: He is alert and oriented to person, place, and time.  Psychiatric:        Behavior: Behavior normal.      UC Treatments / Results  Labs (all labs ordered are listed, but only abnormal results are displayed) Labs Reviewed - No data to display  EKG   Radiology No results found.  Procedures Procedures (including critical care time)  Medications Ordered in UC Medications - No data to display  Initial Impression / Assessment and Plan / UC Course  I have reviewed the triage vital signs and the nursing notes.  Pertinent labs & imaging results that were available during my care of the patient were reviewed by me and considered in my medical decision making (see chart for details).     Mildly tachycardic and hypertensive in triage, otherwise vital signs reassuring.  Suspect viral respiratory infection.  Treat with Phenergan  DM, Astelin  nasal spray, supportive over-the-counter medications and home care.  Return for worsening symptoms.  Final Clinical Impressions(s) / UC Diagnoses   Final diagnoses:  Viral URI with cough   Discharge Instructions   None    ED Prescriptions      Medication Sig Dispense Auth. Provider   promethazine -dextromethorphan (PROMETHAZINE -DM) 6.25-15 MG/5ML syrup Take 5 mLs by mouth 4 (four) times daily as needed. 100 mL Stuart Vernell Norris, PA-C   azelastine  (ASTELIN ) 0.1 % nasal spray Place 1 spray into both nostrils 2 (two) times daily. Use in each nostril as directed 30 mL Stuart Vernell Norris, PA-C      PDMP not reviewed this encounter.   Stuart Vernell Norris, [place] 12/08/23 1408

## 2023-12-08 NOTE — ED Triage Notes (Signed)
 Pt reports nasal congestion, chest congestion, sinus drainage, cough, headache ear pain, weakness x 2 days has taken at home COVID test and it was negative.
